# Patient Record
Sex: Female | Born: 1990 | Race: Black or African American | Hispanic: No | Marital: Single | Smoking: Former smoker
Health system: Southern US, Community
[De-identification: ages and names within clinical notes are randomized; demographics above are authoritative.]

## PROBLEM LIST (undated history)

## (undated) DIAGNOSIS — L732 Hidradenitis suppurativa: Secondary | ICD-10-CM

## (undated) DIAGNOSIS — L02411 Cutaneous abscess of right axilla: Secondary | ICD-10-CM

## (undated) DIAGNOSIS — E538 Deficiency of other specified B group vitamins: Secondary | ICD-10-CM

## (undated) DIAGNOSIS — R0602 Shortness of breath: Secondary | ICD-10-CM

## (undated) DIAGNOSIS — N390 Urinary tract infection, site not specified: Secondary | ICD-10-CM

## (undated) DIAGNOSIS — E559 Vitamin D deficiency, unspecified: Secondary | ICD-10-CM

## (undated) DIAGNOSIS — Z91018 Allergy to other foods: Secondary | ICD-10-CM

## (undated) HISTORY — DX: Shortness of breath: R06.02

## (undated) HISTORY — DX: Vitamin D deficiency, unspecified: E55.9

## (undated) HISTORY — DX: Hidradenitis suppurativa: L73.2

## (undated) HISTORY — DX: Deficiency of other specified B group vitamins: E53.8

## (undated) HISTORY — DX: Allergy to other foods: Z91.018

---

## 2011-04-22 ENCOUNTER — Inpatient Hospital Stay (INDEPENDENT_AMBULATORY_CARE_PROVIDER_SITE_OTHER)
Admission: RE | Admit: 2011-04-22 | Discharge: 2011-04-22 | Disposition: A | Discharge status: Home / Self Care | Payer: 59 | Source: Ambulatory Visit | Attending: Family Medicine | Admitting: Family Medicine

## 2011-04-22 DIAGNOSIS — L02419 Cutaneous abscess of limb, unspecified: Secondary | ICD-10-CM

## 2011-04-25 ENCOUNTER — Inpatient Hospital Stay (HOSPITAL_COMMUNITY)
Admission: RE | Admit: 2011-04-25 | Discharge: 2011-04-25 | Disposition: A | Discharge status: Home / Self Care | Payer: 59 | Source: Ambulatory Visit | Attending: Emergency Medicine | Admitting: Emergency Medicine

## 2011-10-17 ENCOUNTER — Emergency Department (INDEPENDENT_AMBULATORY_CARE_PROVIDER_SITE_OTHER)
Admission: EM | Admit: 2011-10-17 | Discharge: 2011-10-17 | Disposition: A | Discharge status: Home / Self Care | Payer: 59 | Source: Home / Self Care | Attending: Family Medicine | Admitting: Family Medicine

## 2011-10-17 ENCOUNTER — Encounter: Payer: Self-pay | Admitting: *Deleted

## 2011-10-17 DIAGNOSIS — L0390 Cellulitis, unspecified: Secondary | ICD-10-CM

## 2011-10-17 DIAGNOSIS — L0291 Cutaneous abscess, unspecified: Secondary | ICD-10-CM

## 2011-10-17 MED ORDER — DOXYCYCLINE HYCLATE 100 MG PO CAPS: 100.0000 mg | ORAL_CAPSULE | Freq: Two times a day (BID) | ORAL | Status: AC

## 2011-10-17 NOTE — ED Provider Notes (Signed)
History     CSN: [id number] Arrival date & time: 10/17/2011  2:08 PM   First MD Initiated Contact with Patient 10/17/11 1144      Chief Complaint  Patient presents with  . Recurrent Skin Infections    pt with ? abcess right groin onset x few days worse last night - painful     (Consider location/radiation/quality/duration/timing/severity/associated sxs/prior treatment) Patient is a 20 y.o. female presenting with abscess. The history is provided by the patient.  Abscess  This is a new problem. The current episode started less than one week ago. The problem occurs occasionally. The problem has been gradually worsening. The abscess is present on the right upper leg. The problem is mild. The abscess is characterized by redness and painfulness. Past medical history comments: hx of mrsa.    History reviewed. No pertinent past medical history.  History reviewed. No pertinent past surgical history.  History reviewed. No pertinent family history.  History  Substance Use Topics  . Smoking status: Current Everyday Smoker  . Smokeless tobacco: Not on file  . Alcohol Use: No    OB History    Grav Para Term Preterm Abortions TAB SAB Ect Mult Living                  Review of Systems  Constitutional: Negative.   HENT: Negative.   Respiratory: Negative.   Cardiovascular: Negative.   Gastrointestinal: Negative.   Genitourinary: Negative.     Allergies  Fish allergy  Home Medications   Current Outpatient Rx  Name Route Sig Dispense Refill  . DOXYCYCLINE HYCLATE 100 MG PO CAPS Oral Take 1 capsule (100 mg total) by mouth 2 (two) times daily. 20 capsule 0    BP 127/78  Pulse 80  Temp(Src) 98.5 F (36.9 C) (Oral)  Resp 17  SpO2 100%  Physical Exam  Nursing note and vitals reviewed. Constitutional: She appears well-developed and well-nourished. No distress.  Cardiovascular: Normal rate.   Pulmonary/Chest: Effort normal and breath sounds normal.  Skin:       flocculent  abscess right groin. Painful     ED Course  INCISION AND DRAINAGE Date/Time: 10/17/2011 2:31 PM Performed by: Randa Spike Authorized by: Randa Spike Consent: Verbal consent obtained. Risks and benefits: risks, benefits and alternatives were discussed Consent given by: patient Type: abscess Body area: lower extremity (right groin) Anesthesia: local infiltration Local anesthetic: lidocaine 2% with epinephrine Anesthetic total: 2 ml Patient sedated: no Scalpel size: 11 Incision type: cruze. Drainage: purulent (foul smelling) Drainage amount: moderate Patient tolerance: Patient tolerated the procedure well with no immediate complications (gauze dressing applied. good hemostasis).   (including critical care time)  Labs Reviewed - No data to display No results found.   1. Abscess       MDM          Randa Spike, MD 10/17/11 [phone]

## 2012-02-13 ENCOUNTER — Emergency Department (HOSPITAL_COMMUNITY)
Admission: EM | Admit: 2012-02-13 | Discharge: 2012-02-14 | Disposition: A | Discharge status: Home / Self Care | Payer: 59 | Attending: Emergency Medicine | Admitting: Emergency Medicine

## 2012-02-13 ENCOUNTER — Emergency Department (HOSPITAL_COMMUNITY): Payer: 59

## 2012-02-13 ENCOUNTER — Encounter (HOSPITAL_COMMUNITY): Payer: Self-pay | Admitting: Emergency Medicine

## 2012-02-13 DIAGNOSIS — N898 Other specified noninflammatory disorders of vagina: Secondary | ICD-10-CM | POA: Insufficient documentation

## 2012-02-13 DIAGNOSIS — Z3201 Encounter for pregnancy test, result positive: Secondary | ICD-10-CM | POA: Insufficient documentation

## 2012-02-13 DIAGNOSIS — N939 Abnormal uterine and vaginal bleeding, unspecified: Secondary | ICD-10-CM

## 2012-02-13 DIAGNOSIS — R109 Unspecified abdominal pain: Secondary | ICD-10-CM | POA: Insufficient documentation

## 2012-02-13 LAB — URINALYSIS, ROUTINE W REFLEX MICROSCOPIC
Bilirubin Urine: NEGATIVE
Glucose, UA: NEGATIVE mg/dL
Ketones, ur: NEGATIVE mg/dL
Leukocytes, UA: NEGATIVE
Protein, ur: NEGATIVE mg/dL
pH: 8 (ref 5.0–8.0)

## 2012-02-13 LAB — URINE MICROSCOPIC-ADD ON

## 2012-02-13 LAB — ABO/RH: ABO/RH(D): O POS

## 2012-02-13 LAB — HCG, QUANTITATIVE, PREGNANCY: hCG, Beta Chain, Quant, S: 726 m[IU]/mL — ABNORMAL HIGH (ref <5)

## 2012-02-13 NOTE — Discharge Instructions (Signed)
If you develop abdominal pain, lightheadedness, fever or increased bleeding return to the emergency department immediately

## 2012-02-13 NOTE — ED Notes (Signed)
Pt is currently pregnant about 8 weeks, reports went to bathroom about 20-30 mins ago and noticed bright red blood, pt also reports cramping.

## 2012-02-13 NOTE — ED Notes (Signed)
Called Ultrasound to get a timeframe on ultrasound, and they stated she is next. Notified family. Will continue to monitor.

## 2012-02-14 NOTE — ED Provider Notes (Signed)
  I performed a history and physical examination of Leslie Adams and discussed her management with Dr. Criss Alvine.  I agree with the history, physical, assessment, and plan of care, with the following exceptions: None  This young female, presents with vaginal bleeding, closed os, hCG less than 1000.  Patient discharged with explicit instructions to return in 2 days for repeat lab draw.  Leslie Jarvis, MD 02/14/12 [phone]

## 2012-02-14 NOTE — ED Provider Notes (Signed)
History     CSN: [id number]  Arrival date & time 02/13/12  1736   First MD Initiated Contact with Patient 02/13/12 1840      Chief Complaint  Patient presents with  . Vaginal Bleeding  . Abdominal Cramping    (Consider location/radiation/quality/duration/timing/severity/associated sxs/prior treatment) Patient is a 21 y.o. female presenting with vaginal bleeding. The history is provided by the patient.  Vaginal Bleeding This is a new problem. The current episode started today. The problem occurs 2 to 4 times per day. The problem has been unchanged. Associated symptoms include abdominal pain. Pertinent negatives include no chest pain, chills, congestion, fever, headaches, nausea, numbness, rash, vomiting or weakness. The symptoms are aggravated by nothing. She has tried nothing for the symptoms.    History reviewed. No pertinent past medical history.  History reviewed. No pertinent past surgical history.  No family history on file.  History  Substance Use Topics  . Smoking status: Former Smoker    Quit date: 02/06/2012  . Smokeless tobacco: Not on file  . Alcohol Use: No    OB History    Grav Para Term Preterm Abortions TAB SAB Ect Mult Living                  Review of Systems  Constitutional: Negative for fever and chills.  HENT: Negative for congestion and rhinorrhea.   Respiratory: Negative for shortness of breath.   Cardiovascular: Negative for chest pain and leg swelling.  Gastrointestinal: Positive for abdominal pain. Negative for nausea, vomiting and constipation.  Genitourinary: Positive for vaginal bleeding, menstrual problem (LMP 1/18) and pelvic pain. Negative for urgency, decreased urine volume, vaginal discharge and difficulty urinating.  Skin: Negative for rash and wound.  Neurological: Negative for syncope, weakness, light-headedness, numbness and headaches.  Psychiatric/Behavioral: Negative for confusion.  All other systems reviewed and are  negative.    Allergies  Fish allergy  Home Medications  No current outpatient prescriptions on file.  BP 118/72  Pulse 72  Temp(Src) 98.4 F (36.9 C) (Oral)  Resp 18  SpO2 98%  LMP 12/17/2011  Physical Exam  Nursing note and vitals reviewed. Constitutional: She is oriented to person, place, and time. She appears well-developed and well-nourished. No distress.  HENT:  Head: Normocephalic and atraumatic.  Right Ear: External ear normal.  Left Ear: External ear normal.  Nose: Nose normal.  Mouth/Throat: Oropharynx is clear and moist.  Neck: Neck supple.  Cardiovascular: Normal rate, regular rhythm, normal heart sounds and intact distal pulses.   Pulmonary/Chest: Effort normal and breath sounds normal. No respiratory distress. She has no wheezes. She has no rales.  Abdominal: Soft. She exhibits no distension. There is no tenderness. There is no CVA tenderness.  Genitourinary: Vagina normal. Uterus is not tender. Cervix exhibits no motion tenderness, no discharge and no friability.       Cervical Os closed. Dark blood present in vault  Musculoskeletal: She exhibits no edema.  Lymphadenopathy:    She has no cervical adenopathy.  Neurological: She is alert and oriented to person, place, and time.  Skin: Skin is warm and dry. She is not diaphoretic. No pallor.    ED Course  Procedures (including critical care time)  Labs Reviewed  URINALYSIS, ROUTINE W REFLEX MICROSCOPIC - Abnormal; Notable for the following:    Hgb urine dipstick LARGE (*)    Urobilinogen, UA 2.0 (*)    All other components within normal limits  POCT PREGNANCY, URINE - Abnormal; Notable for the following:  Preg Test, Ur POSITIVE (*)    All other components within normal limits  URINE MICROSCOPIC-ADD ON - Abnormal; Notable for the following:    Squamous Epithelial / LPF FEW (*)    All other components within normal limits  HCG, QUANTITATIVE, PREGNANCY - Abnormal; Notable for the following:    hCG,  Beta Chain, Quant, S 726 (*)    All other components within normal limits  ABO/RH   US Ob Comp Less 14 Wks  02/13/2012  *RADIOLOGY REPORT*  Clinical Data: Bleeding.  Abdominal pain.  OBSTETRIC <14 WK ULTRASOUND, TRANSVAGINAL OB US  Technique:  Transabdominal and transvaginal ultrasound was performed for evaluation of the gestation as well as the maternal uterus and adnexal regions.  Findings:  No intrauterine gestational sac, yolk sac, embryo or fetal cardiac activity identified.  Maternal uterus/adnexae: The right ovary appears normal.  The left ovary is also normal.  The endometrium measures 11.3 mm in thickness containing complex debris and fluid.  Fluid is also identified within the cervix.  No free fluid within the pelvis.  IMPRESSION:  1.  No evidence for living intrauterine pregnancy. 2.  The endometrial cavity appears filled with complex debris and there is fluid within the cervical canal.  Findings may represent abortion in progress. Advise follow-up imaging as and 7-10 days along with serial quantitative Beta HCG.  Original Report Authenticated By: Rosealee Albee, M.D.   US Ob Transvaginal  02/13/2012  *RADIOLOGY REPORT*  Clinical Data: Bleeding.  Abdominal pain.  OBSTETRIC <14 WK ULTRASOUND, TRANSVAGINAL OB US  Technique:  Transabdominal and transvaginal ultrasound was performed for evaluation of the gestation as well as the maternal uterus and adnexal regions.  Findings:  No intrauterine gestational sac, yolk sac, embryo or fetal cardiac activity identified.  Maternal uterus/adnexae: The right ovary appears normal.  The left ovary is also normal.  The endometrium measures 11.3 mm in thickness containing complex debris and fluid.  Fluid is also identified within the cervix.  No free fluid within the pelvis.  IMPRESSION:  1.  No evidence for living intrauterine pregnancy. 2.  The endometrial cavity appears filled with complex debris and there is fluid within the cervical canal.  Findings may  represent abortion in progress. Advise follow-up imaging as and 7-10 days along with serial quantitative Beta HCG.  Original Report Authenticated By: Rosealee Albee, M.D.     1. Pregnancy test positive   2. Vaginal bleeding       MDM  21 yo female who believes she's [redacted] weeks pregnant (LMP 1/18) presents with 1 day of vaginal bleeding and lower abd cramping. No abd tenderness elicited, no VS instability. No UTI. Cervical os closed, no ovarian fullness, tenderness. TV u/s does not see ectopic or IUP. Beta HCG 726, below threshold to see IUP or ectopic. [facility] as well as repeat U/S. Discussed strict return precautions for possible ectopic.         Pricilla Loveless, MD 02/14/12 [phone]

## 2012-08-04 ENCOUNTER — Encounter (HOSPITAL_COMMUNITY): Payer: Self-pay | Admitting: *Deleted

## 2012-08-04 ENCOUNTER — Emergency Department (INDEPENDENT_AMBULATORY_CARE_PROVIDER_SITE_OTHER)
Admission: EM | Admit: 2012-08-04 | Discharge: 2012-08-04 | Disposition: A | Discharge status: Home / Self Care | Payer: 59 | Source: Home / Self Care | Attending: Emergency Medicine | Admitting: Emergency Medicine

## 2012-08-04 DIAGNOSIS — IMO0002 Reserved for concepts with insufficient information to code with codable children: Secondary | ICD-10-CM

## 2012-08-04 DIAGNOSIS — L02411 Cutaneous abscess of right axilla: Secondary | ICD-10-CM

## 2012-08-04 HISTORY — DX: Cutaneous abscess of right axilla: L02.411

## 2012-08-04 MED ORDER — LIDOCAINE-EPINEPHRINE 2 %-1:100000 IJ SOLN
5.0000 mL | Freq: Once | INTRAMUSCULAR | Status: AC
  Administered 2012-08-04: 5 mL

## 2012-08-04 MED ORDER — MUPIROCIN 2 % EX OINT: TOPICAL_OINTMENT | Freq: Three times a day (TID) | CUTANEOUS | Status: AC

## 2012-08-04 MED ORDER — CHLORHEXIDINE GLUCONATE 4 % EX LIQD: 60.0000 mL | Freq: Every day | CUTANEOUS | Status: AC | PRN

## 2012-08-04 MED ORDER — BACITRACIN 500 UNIT/GM EX OINT
1.0000 "application " | TOPICAL_OINTMENT | Freq: Once | CUTANEOUS | Status: AC
  Administered 2012-08-04: 1 via TOPICAL

## 2012-08-04 MED ORDER — IBUPROFEN 600 MG PO TABS: 600.0000 mg | ORAL_TABLET | Freq: Four times a day (QID) | ORAL | Status: AC | PRN

## 2012-08-04 MED ORDER — HYDROCODONE-ACETAMINOPHEN 5-325 MG PO TABS: 2.0000 | ORAL_TABLET | ORAL | Status: AC | PRN

## 2012-08-04 MED ORDER — DOXYCYCLINE HYCLATE 100 MG PO CAPS: 100.0000 mg | ORAL_CAPSULE | Freq: Two times a day (BID) | ORAL | Status: AC

## 2012-08-04 NOTE — ED Provider Notes (Signed)
History     CSN: [id number]  Arrival date & time 08/04/12  0845   First MD Initiated Contact with Patient 08/04/12 0920      Chief Complaint  Patient presents with  . Abscess    (Consider location/radiation/quality/duration/timing/severity/associated sxs/prior treatment) HPI Comments: Pt with painful erythematous mass of gradually increasing size in R axilla starting several days ago. No N/V, fevers, drainage. Has been applying warm compresses  w/o relief. Has not tried anything for pain. H/o recurrent skin infections in this area. Patient is not diabetic.   ROS as noted in HPI. All other ROS negative.   Patient is a 21 y.o. female presenting with abscess. The history is provided by the patient. No language interpreter was used.  Abscess  This is a recurrent problem. The current episode started less than one week ago. The onset was gradual. The problem has been gradually worsening. Affected Location: R axilla. The problem is moderate. The abscess is characterized by painfulness and redness. It is unknown what she was exposed to. The abscess first occurred at home. Pertinent negatives include no fever.    Past Medical History  Diagnosis Date  . Abscess of axilla, right     History reviewed. No pertinent past surgical history.  History reviewed. No pertinent family history.  History  Substance Use Topics  . Smoking status: Former Smoker    Quit date: 02/06/2012  . Smokeless tobacco: Not on file  . Alcohol Use: No    OB History    Grav Para Term Preterm Abortions TAB SAB Ect Mult Living                  Review of Systems  Constitutional: Negative for fever.    Allergies  Fish allergy  Home Medications   Current Outpatient Rx  Name Route Sig Dispense Refill  . ACETAMINOPHEN 500 MG PO TABS Oral Take 500 mg by mouth every 6 (six) hours as needed.    . CHLORHEXIDINE GLUCONATE 4 % EX LIQD Topical Apply 60 mLs (4 application total) topically daily as needed. Use  daily when bathing for 1-2 weeks 120 mL 0  . DOXYCYCLINE HYCLATE 100 MG PO CAPS Oral Take 1 capsule (100 mg total) by mouth 2 (two) times daily. 20 capsule 0  . HYDROCODONE-ACETAMINOPHEN 5-325 MG PO TABS Oral Take 2 tablets by mouth every 4 (four) hours as needed for pain. 20 tablet 0  . IBUPROFEN 600 MG PO TABS Oral Take 1 tablet (600 mg total) by mouth every 6 (six) hours as needed for pain. 30 tablet 0  . MUPIROCIN 2 % EX OINT Topical Apply topically 3 (three) times daily. Apply after warm soak for 10 minutes 22 g 0    BP 131/78  Pulse 80  Temp 98.6 F (37 C) (Oral)  Resp 18  SpO2 100%  LMP 07/21/2012  Physical Exam  Nursing note and vitals reviewed. Constitutional: She is oriented to person, place, and time. She appears well-developed and well-nourished. No distress.  HENT:  Head: Normocephalic and atraumatic.  Eyes: Conjunctivae and EOM are normal.  Neck: Normal range of motion.  Cardiovascular: Normal rate.   Pulmonary/Chest: Effort normal.         3x4 centimeter area of tender erythema central induration. R axilla, see drawing. No expressible purulent drainage.  Abdominal: She exhibits no distension.  Musculoskeletal: Normal range of motion.  Neurological: She is alert and oriented to person, place, and time. Coordination normal.  Skin: Skin is warm and  dry.  Psychiatric: She has a normal mood and affect. Her behavior is normal. Judgment and thought content normal.    ED Course  INCISION AND DRAINAGE Date/Time: 08/04/2012 10:02 AM Performed by: Luiz Blare Authorized by: Luiz Blare Consent: Verbal consent obtained. Risks and benefits: risks, benefits and alternatives were discussed Consent given by: patient Patient understanding: patient states understanding of the procedure being performed Patient consent: the patient's understanding of the procedure matches consent given Required items: required blood products, implants, devices, and special  equipment available Patient identity confirmed: verbally with patient Time out: Immediately prior to procedure a "time out" was called to verify the correct patient, procedure, equipment, support staff and site/side marked as required. Type: abscess Body area: upper extremity (R axilla) Anesthesia: local infiltration Local anesthetic: lidocaine 2% with epinephrine Anesthetic total: 4 ml Patient sedated: no Scalpel size: 11 Incision type: cruciate. Drainage: purulent Drainage amount: copious Wound treatment: wound left open Packing material: none Patient tolerance: Patient tolerated the procedure well with no immediate complications. Comments: Performed blunt dissection to break up loculations, irrigated with 6 mL lido with epi 2%. cx sent   (including critical care time)   Labs Reviewed  CULTURE, ROUTINE-ABSCESS   No results found.   1. Abscess of right axilla     MDM  Send purulent material for culture. Home On doxycycline, Norco, ibuprofen, warm compresses. Prescribing Bactroban, as this is a recurrent issue. Patient will followup here or with primary care physician of her choice in 2 days if no significant improvement. Discussed signs and symptoms that should prompt earlier return. Patient agrees with plan.  Luiz Blare, MD 08/05/12 [phone]

## 2012-08-04 NOTE — ED Notes (Signed)
Pt is here with complaints of right axilla abcess.

## 2012-08-07 LAB — CULTURE, ROUTINE-ABSCESS

## 2012-08-07 NOTE — ED Notes (Signed)
Abscess culture R axilla: Mod. Staph. Aureus.  Pt. adequately treated with Doxycycline.  Vassie Moselle 08/07/2012

## 2012-11-23 ENCOUNTER — Encounter (HOSPITAL_COMMUNITY): Payer: Self-pay | Admitting: *Deleted

## 2012-11-23 ENCOUNTER — Emergency Department (HOSPITAL_COMMUNITY)
Admission: EM | Admit: 2012-11-23 | Discharge: 2012-11-23 | Disposition: A | Discharge status: Home / Self Care | Payer: 59 | Attending: Emergency Medicine | Admitting: Emergency Medicine

## 2012-11-23 DIAGNOSIS — Z872 Personal history of diseases of the skin and subcutaneous tissue: Secondary | ICD-10-CM | POA: Insufficient documentation

## 2012-11-23 DIAGNOSIS — N390 Urinary tract infection, site not specified: Secondary | ICD-10-CM

## 2012-11-23 DIAGNOSIS — Z3202 Encounter for pregnancy test, result negative: Secondary | ICD-10-CM | POA: Insufficient documentation

## 2012-11-23 DIAGNOSIS — Z79899 Other long term (current) drug therapy: Secondary | ICD-10-CM | POA: Insufficient documentation

## 2012-11-23 DIAGNOSIS — F17200 Nicotine dependence, unspecified, uncomplicated: Secondary | ICD-10-CM | POA: Insufficient documentation

## 2012-11-23 DIAGNOSIS — R319 Hematuria, unspecified: Secondary | ICD-10-CM | POA: Insufficient documentation

## 2012-11-23 DIAGNOSIS — Z87440 Personal history of urinary (tract) infections: Secondary | ICD-10-CM | POA: Insufficient documentation

## 2012-11-23 HISTORY — DX: Urinary tract infection, site not specified: N39.0

## 2012-11-23 LAB — URINALYSIS, ROUTINE W REFLEX MICROSCOPIC
Glucose, UA: NEGATIVE mg/dL
Specific Gravity, Urine: 1.023 (ref 1.005–1.030)
pH: 6.5 (ref 5.0–8.0)

## 2012-11-23 LAB — URINE MICROSCOPIC-ADD ON

## 2012-11-23 LAB — POCT PREGNANCY, URINE: Preg Test, Ur: NEGATIVE

## 2012-11-23 MED ORDER — PHENAZOPYRIDINE HCL 200 MG PO TABS: 200.0000 mg | ORAL_TABLET | Freq: Three times a day (TID) | ORAL | Status: DC

## 2012-11-23 MED ORDER — ALBUTEROL SULFATE (5 MG/ML) 0.5% IN NEBU
INHALATION_SOLUTION | RESPIRATORY_TRACT | Status: AC
  Filled 2012-11-23: qty 2

## 2012-11-23 MED ORDER — SULFAMETHOXAZOLE-TRIMETHOPRIM 800-160 MG PO TABS: 1.0000 | ORAL_TABLET | Freq: Two times a day (BID) | ORAL | Status: DC

## 2012-11-23 NOTE — ED Provider Notes (Signed)
History     CSN: [id number]  Arrival date & time 11/23/12  1110   First MD Initiated Contact with Patient 11/23/12 1140      Chief Complaint  Patient presents with  . Urinary Tract Infection    (Consider location/radiation/quality/duration/timing/severity/associated sxs/prior treatment) HPI  Pt presents to the ED with complaints of dysuria and a "pulling sensation" at the end of urinating. She also endorses some pink tinged tissue after voiding. She denies any odor, vomiting, abd, back pain, fevers, chills or weakness. States this is her first UTI. No vaginal complaints. nad vss  Past Medical History  Diagnosis Date  . Abscess of axilla, right   . UTI (lower urinary tract infection)     History reviewed. No pertinent past surgical history.  No family history on file.  History  Substance Use Topics  . Smoking status: Current Every Day Smoker    Last Attempt to Quit: 02/06/2012  . Smokeless tobacco: Not on file  . Alcohol Use: No    OB History    Grav Para Term Preterm Abortions TAB SAB Ect Mult Living                  Review of Systems  Review of Systems  Gen: no weight loss, fevers, chills, night sweats  Eyes: no discharge or drainage, no occular pain or visual changes  Nose: no epistaxis or rhinorrhea  Mouth: no dental pain, no sore throat  Neck: no neck pain  Lungs:No wheezing, coughing or hemoptysis CV: no chest pain, palpitations, dependent edema or orthopnea  Abd: no abdominal pain, nausea, vomiting  GU: + dysuria no gross hematuria  MSK:  No abnormalities  Neuro: no headache, no focal neurologic deficits  Skin: no abnormalities Psyche: negative.    Allergies  Fish allergy  Home Medications   Current Outpatient Rx  Name  Route  Sig  Dispense  Refill  . ACETAMINOPHEN 500 MG PO TABS   Oral   Take 500 mg by mouth every 6 (six) hours as needed.         Marland Kitchen PHENAZOPYRIDINE HCL 200 MG PO TABS   Oral   Take 1 tablet (200 mg total) by mouth 3  (three) times daily.   6 tablet   0   . SULFAMETHOXAZOLE-TRIMETHOPRIM 800-160 MG PO TABS   Oral   Take 1 tablet by mouth every 12 (twelve) hours.   10 tablet   0     BP 123/81  Pulse 81  Temp 98 F (36.7 C)  Resp 12  SpO2 99%  Physical Exam  Nursing note and vitals reviewed. Constitutional: She appears well-developed and well-nourished. No distress.  HENT:  Head: Normocephalic and atraumatic.  Eyes: Pupils are equal, round, and reactive to light.  Neck: Normal range of motion. Neck supple.  Cardiovascular: Normal rate and regular rhythm.   Pulmonary/Chest: Effort normal.  Abdominal: Soft.  Neurological: She is alert.  Skin: Skin is warm and dry.    ED Course  Procedures (including critical care time)  Labs Reviewed  URINALYSIS, ROUTINE W REFLEX MICROSCOPIC - Abnormal; Notable for the following:    APPearance CLOUDY (*)     Hgb urine dipstick LARGE (*)     Protein, ur 100 (*)     Leukocytes, UA MODERATE (*)     All other components within normal limits  URINE MICROSCOPIC-ADD ON - Abnormal; Notable for the following:    Bacteria, UA FEW (*)     All other components  within normal limits  POCT PREGNANCY, URINE  URINE CULTURE   No results found.   1. UTI (lower urinary tract infection)       MDM  Rx. Bactrim and pyridium. Urine culture sent out   Pt has been advised of the symptoms that warrant their return to the ED. Patient has voiced understanding and has agreed to follow-up with the PCP or specialist.        Dorthula Matas, PA 11/23/12 1228

## 2012-11-23 NOTE — ED Notes (Signed)
uti symptoms: at end of urination "feels like pulling." bleeding after urination not other times. No foul odor. Urine is cloudy. No vaginal d/c.

## 2012-11-24 LAB — URINE CULTURE: Culture: NO GROWTH

## 2012-11-26 NOTE — ED Provider Notes (Signed)
Medical screening examination/treatment/procedure(s) were performed by non-physician practitioner and as supervising physician I was immediately available for consultation/collaboration.  Jay Haskew R. Zarif Rathje, MD 11/26/12 0855 

## 2013-05-28 IMAGING — US US OB COMP LESS 14 WK
1 series · 14 of 28 positions shown · non-contrast
Comparison: none

CLINICAL DATA: Bleeding.  Abdominal pain.

OBSTETRIC <14 WK ULTRASOUND, TRANSVAGINAL OB US
TECHNIQUE: Transabdominal and transvaginal ultrasound was
performed for evaluation of the gestation as well as the maternal
uterus and adnexal regions.

[Series 1: us ob comp less 14 wk · 0.20mm/px · 14 of 51 slices shown]
[im 2/51]
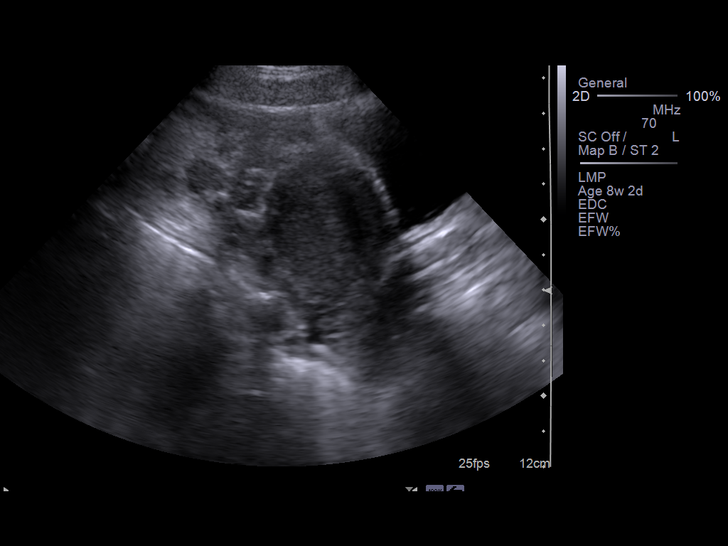
[im 6/51]
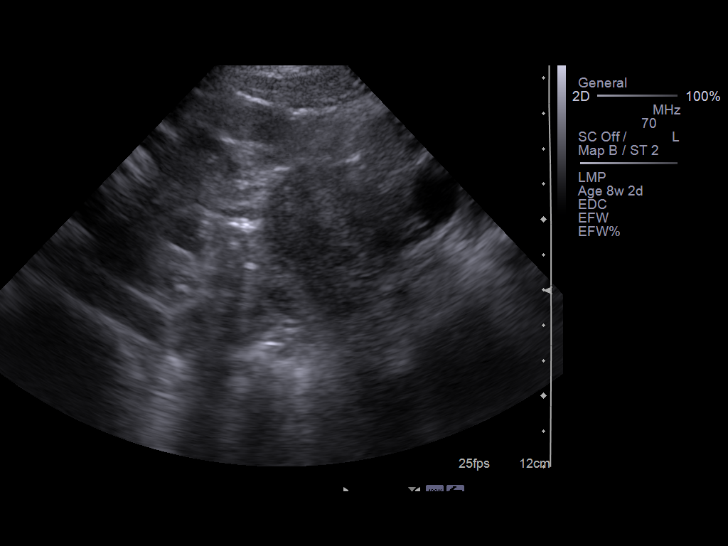
[im 10/51]
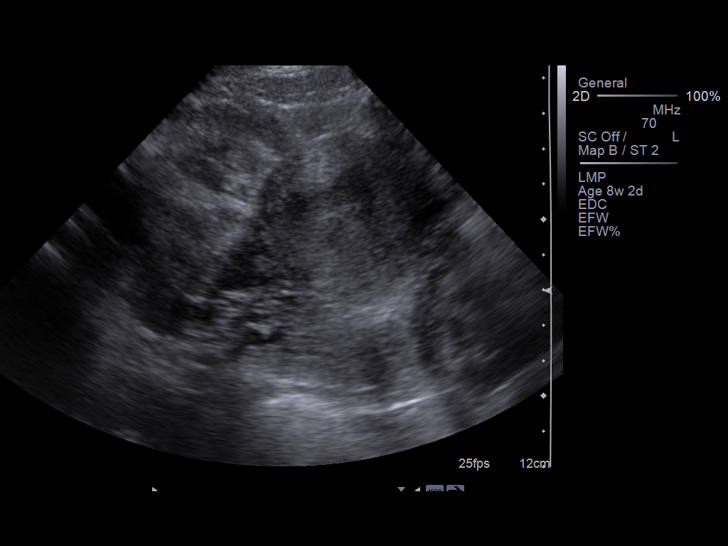
[im 13/51]
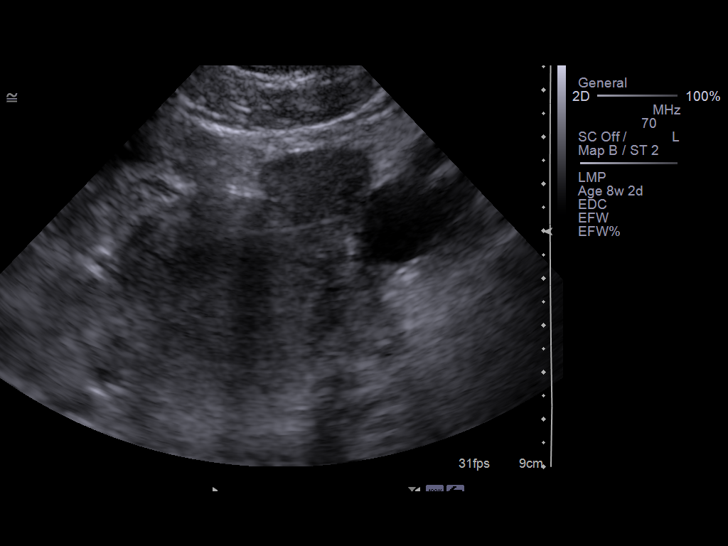
[im 17/51]
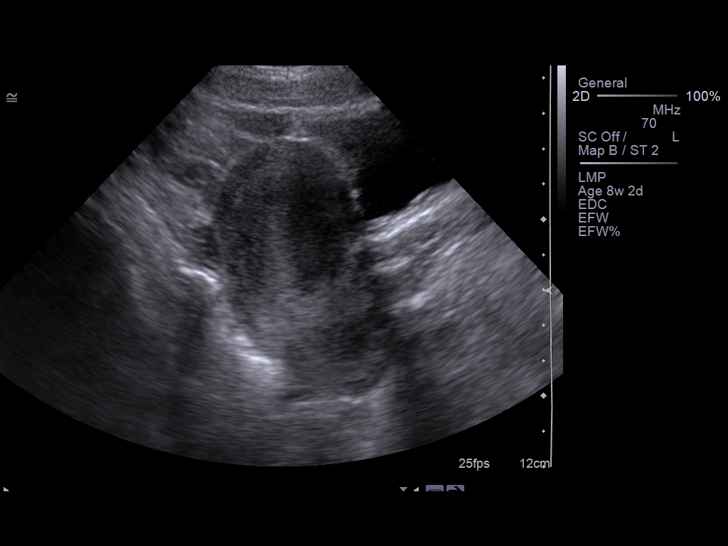
[im 21/51]
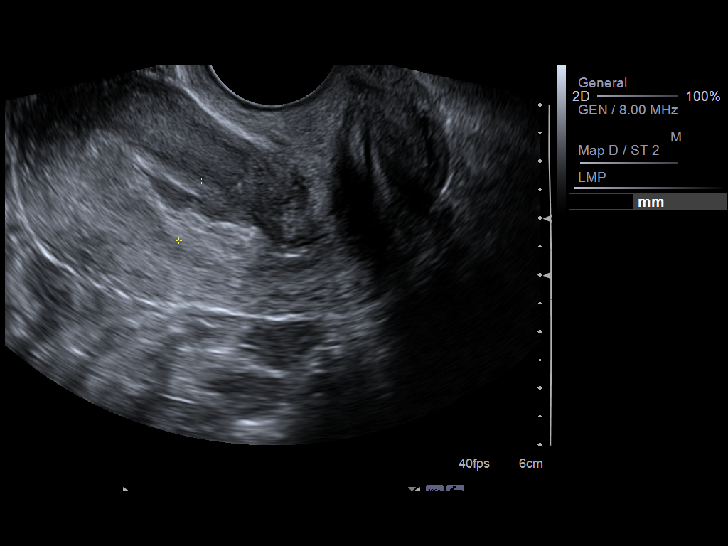
[im 25/51]
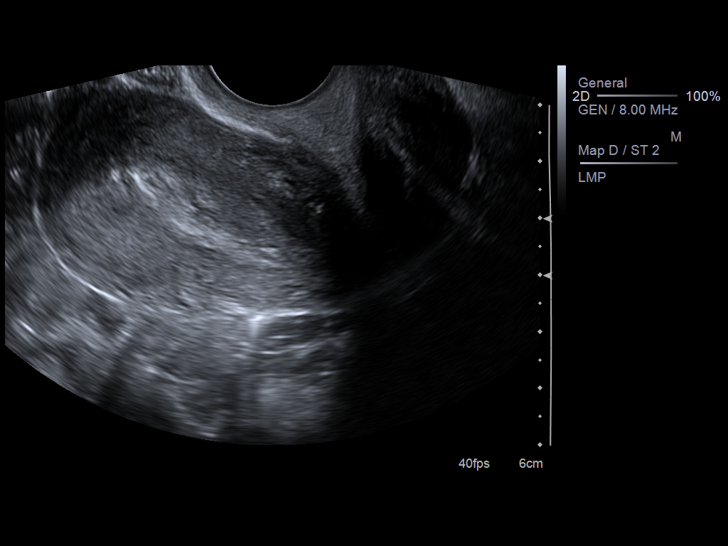
[im 28/51]
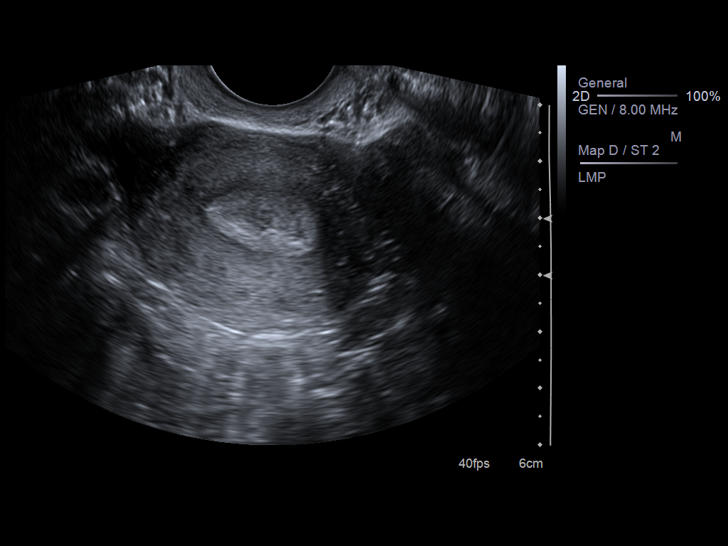
[im 32/51]
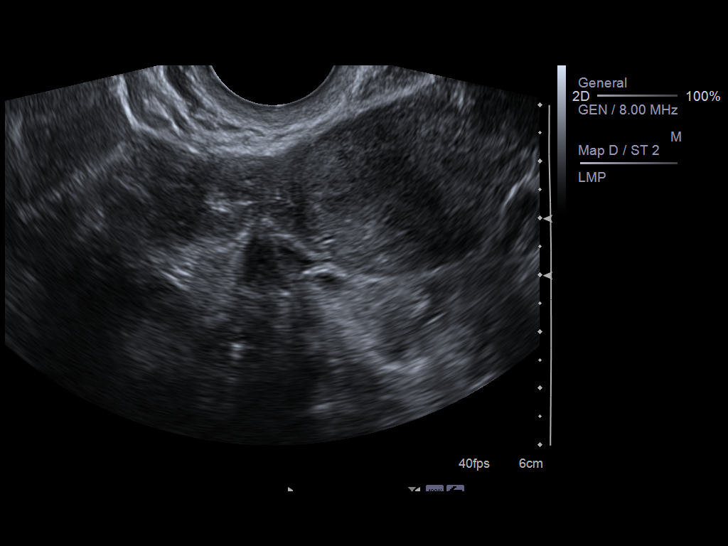
[im 36/51]
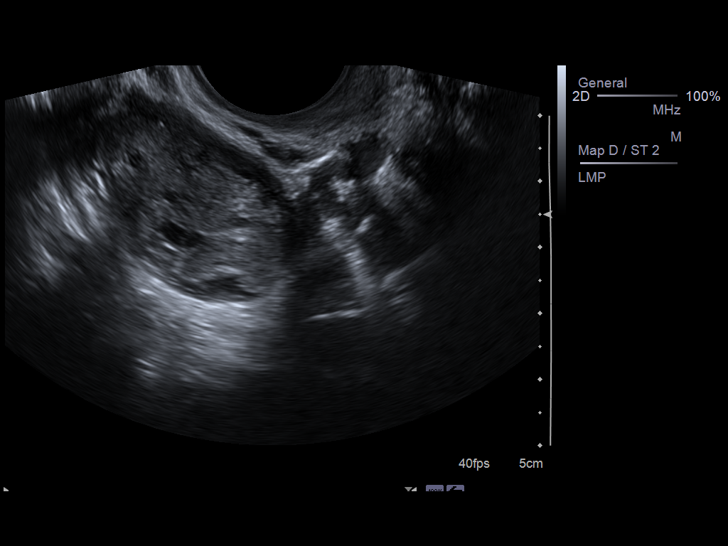
[im 39/51]
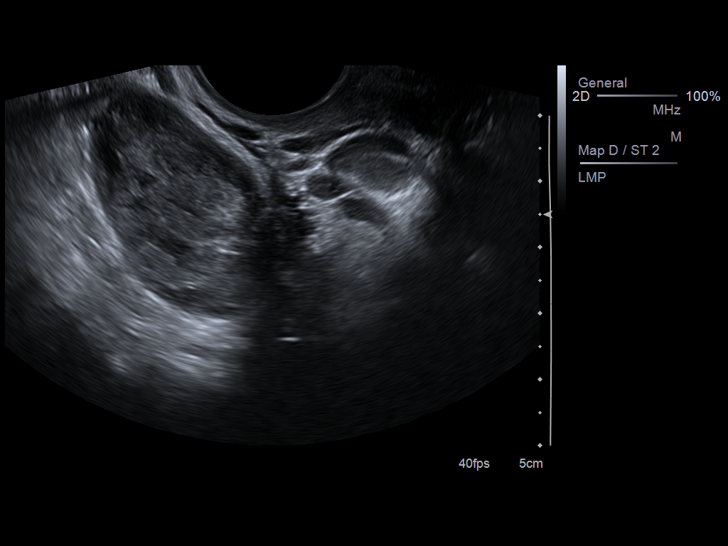
[im 43/51]
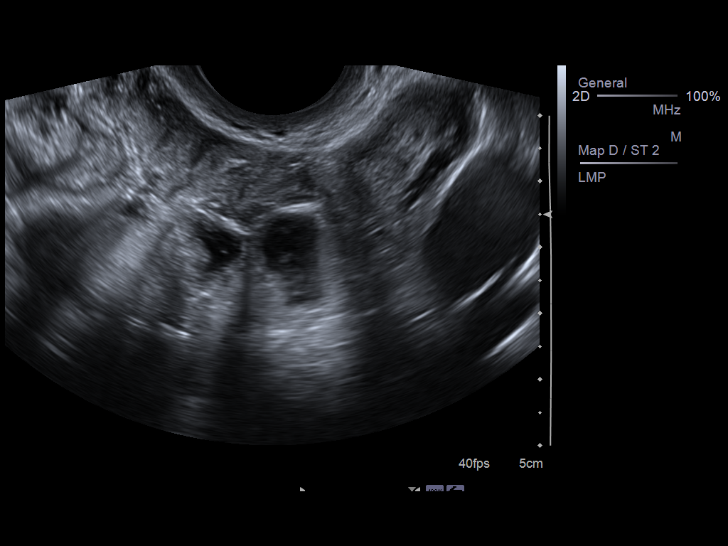
[im 47/51]
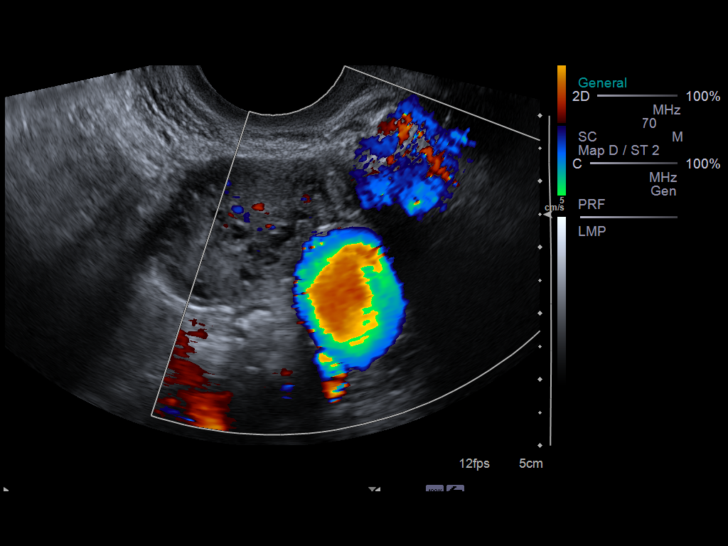
[im 51/51]
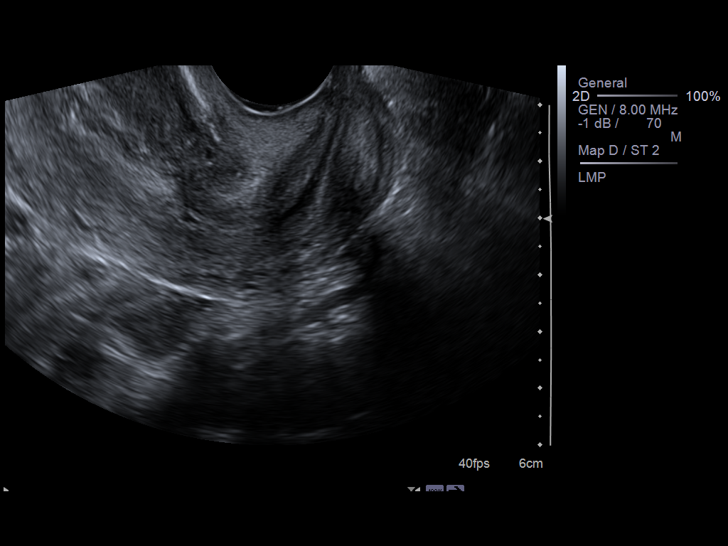

[14 of 28 positions shown; findings below may reference images not displayed]

FINDINGS: No intrauterine gestational sac, yolk sac, embryo or fetal cardiac
activity identified.

Maternal uterus/adnexae:
The right ovary appears normal.

The left ovary is also normal.

The endometrium measures 11.3 mm in thickness containing complex
debris and fluid.  Fluid is also identified within the cervix.

No free fluid within the pelvis.
IMPRESSION: 1.  No evidence for living intrauterine pregnancy.
2.  The endometrial cavity appears filled with complex debris and
there is fluid within the cervical canal.  Findings may represent
abortion in progress. Advise follow-up imaging as and 7-10 days
along with serial quantitative Beta HCG.

## 2013-12-06 ENCOUNTER — Emergency Department (HOSPITAL_COMMUNITY)
Admission: EM | Admit: 2013-12-06 | Discharge: 2013-12-06 | Disposition: A | Discharge status: Home / Self Care | Payer: 59 | Attending: Emergency Medicine | Admitting: Emergency Medicine

## 2013-12-06 ENCOUNTER — Encounter (HOSPITAL_COMMUNITY): Payer: Self-pay | Admitting: Emergency Medicine

## 2013-12-06 DIAGNOSIS — Z79899 Other long term (current) drug therapy: Secondary | ICD-10-CM | POA: Insufficient documentation

## 2013-12-06 DIAGNOSIS — Z87440 Personal history of urinary (tract) infections: Secondary | ICD-10-CM | POA: Insufficient documentation

## 2013-12-06 DIAGNOSIS — F17200 Nicotine dependence, unspecified, uncomplicated: Secondary | ICD-10-CM | POA: Insufficient documentation

## 2013-12-06 DIAGNOSIS — L0291 Cutaneous abscess, unspecified: Secondary | ICD-10-CM

## 2013-12-06 DIAGNOSIS — IMO0002 Reserved for concepts with insufficient information to code with codable children: Secondary | ICD-10-CM | POA: Insufficient documentation

## 2013-12-06 MED ORDER — SULFAMETHOXAZOLE-TRIMETHOPRIM 800-160 MG PO TABS: 2.0000 | ORAL_TABLET | Freq: Two times a day (BID) | ORAL | Status: DC

## 2013-12-06 NOTE — ED Notes (Signed)
Pt with abscess to R upper arm x 1.5 weeks. Pt states it is draining, but it is a "big hole".

## 2013-12-06 NOTE — ED Provider Notes (Signed)
CSN: [id number]     Arrival date & time 12/06/13  1212 History  This chart was scribed for non-physician practitioner working with Gilda Creasehristopher J. Pollina, MD by Ashley JacobsBrittany Andrews, ED scribe. This patient was seen in room TR07C/TR07C and the patient's care was started at 1:30 PM.   First MD Initiated Contact with Patient 12/06/13 1327     Chief Complaint  Patient presents with  . Abscess   (Consider location/radiation/quality/duration/timing/severity/associated sxs/prior Treatment) Patient is a 23 y.o. female presenting with abscess. The history is provided by the patient and medical records. No language interpreter was used.  Abscess Associated symptoms: no fever, no nausea and no vomiting    HPI Comments: Leslie Adams is a 23 y.o. female who presents to the Emergency Department complaining of right arm abscess with onset of 1.5 weeks PTA. The abscess has been draining purulent fluid and is tender.  She currently smokes cigarettes everyday. Pt has a past medical hx of abscess at her right axilla. Pt denies fever, chills, nausea, or vomiting.  She denies hx of HIV or DM.  Past Medical History  Diagnosis Date  . Abscess of axilla, right   . UTI (lower urinary tract infection)    History reviewed. No pertinent past surgical history. No family history on file. History  Substance Use Topics  . Smoking status: Current Every Day Smoker -- 0.25 packs/day    Types: Cigarettes    Last Attempt to Quit: 02/06/2012  . Smokeless tobacco: Not on file  . Alcohol Use: No   OB History   Grav Para Term Preterm Abortions TAB SAB Ect Mult Living                 Review of Systems  Constitutional: Negative for fever and chills.  Gastrointestinal: Negative for nausea and vomiting.  Skin: Positive for wound (right upper arm abscess).  All other systems reviewed and are negative.    Allergies  Fish allergy  Home Medications   Current Outpatient Rx  Name  Route  Sig  Dispense  Refill  .  acetaminophen (TYLENOL) 500 MG tablet   Oral   Take 500 mg by mouth every 6 (six) hours as needed.         . phenazopyridine (PYRIDIUM) 200 MG tablet   Oral   Take 1 tablet (200 mg total) by mouth 3 (three) times daily.   6 tablet   0   . sulfamethoxazole-trimethoprim (SEPTRA DS) 800-160 MG per tablet   Oral   Take 1 tablet by mouth every 12 (twelve) hours.   10 tablet   0    BP 130/75  Pulse 66  Temp(Src) 98.3 F (36.8 C) (Oral)  Resp 18  Ht 5' 5.5" (1.664 m)  Wt 188 lb (85.276 kg)  BMI 30.80 kg/m2  SpO2 100% Physical Exam  Nursing note and vitals reviewed. Constitutional: She is oriented to person, place, and time. She appears well-developed and well-nourished. No distress.  HENT:  Head: Normocephalic and atraumatic.  Eyes: Conjunctivae are normal. No scleral icterus.  Neck: Normal range of motion.  Cardiovascular: Normal rate, regular rhythm and intact distal pulses.   Pulmonary/Chest: Effort normal and breath sounds normal.  Abdominal: Soft. She exhibits no distension. There is no tenderness.  Lymphadenopathy:    She has no cervical adenopathy.  Neurological: She is alert and oriented to person, place, and time.  Skin: Skin is warm and dry. She is not diaphoretic. There is erythema.  1 cm area of  right upper arm that is open.  No active drainage.  Mild surrounding erythema and edema No fluctuance  Psychiatric: She has a normal mood and affect.    ED Course  Procedures (including critical care time) DIAGNOSTIC STUDIES: Oxygen Saturation is 100% on room air, normal by my interpretation.    COORDINATION OF CARE:  1:31 PM Discussed course of care with pt which includes incision and drainage  . Pt understands and agrees.  Labs Review Labs Reviewed - No data to display Imaging Review No results found.  EKG Interpretation   None      INCISION AND DRAINAGE Performed by: Santiago Glad Consent: Verbal consent obtained. Risks and benefits: risks,  benefits and alternatives were discussed Type: abscess  Body area: right upper arm  Anesthesia: local infiltration  Incision was made with a scalpel.  Local anesthetic: lidocaine 2% with epinephrine  Anesthetic total: 3 ml  Complexity: complex Blunt dissection to break up loculations  Drainage: none  Drainage amount: none  Patient tolerance: Patient tolerated the procedure well with no immediate complications.    MDM  No diagnosis found. Patient presenting with an abscess of her right upper arm.  Area has been actively draining and was open on exam.  Incision and drainage performed in the ED without additional drainage.  Patient stable for discharge.  Return precautions given.  I personally performed the services described in this documentation, which was scribed in my presence. The recorded information has been reviewed and is accurate.     Santiago Glad, PA-C 12/06/13 1542

## 2013-12-11 NOTE — ED Provider Notes (Signed)
Medical screening examination/treatment/procedure(s) were performed by non-physician practitioner and as supervising physician I was immediately available for consultation/collaboration.  Christopher J. Pollina, MD 12/11/13 1101 

## 2015-04-18 ENCOUNTER — Other Ambulatory Visit: Payer: Self-pay | Admitting: Obstetrics & Gynecology

## 2015-04-18 ENCOUNTER — Other Ambulatory Visit (HOSPITAL_COMMUNITY)
Admission: RE | Admit: 2015-04-18 | Discharge: 2015-04-18 | Disposition: A | Discharge status: Home / Self Care | Payer: 59 | Source: Ambulatory Visit | Attending: Obstetrics & Gynecology | Admitting: Obstetrics & Gynecology

## 2015-04-18 DIAGNOSIS — Z113 Encounter for screening for infections with a predominantly sexual mode of transmission: Secondary | ICD-10-CM | POA: Insufficient documentation

## 2015-04-18 DIAGNOSIS — Z01419 Encounter for gynecological examination (general) (routine) without abnormal findings: Secondary | ICD-10-CM | POA: Diagnosis present

## 2015-04-21 LAB — CYTOLOGY - PAP

## 2015-08-11 ENCOUNTER — Encounter (HOSPITAL_COMMUNITY): Payer: Self-pay | Admitting: *Deleted

## 2015-08-11 ENCOUNTER — Emergency Department (INDEPENDENT_AMBULATORY_CARE_PROVIDER_SITE_OTHER)
Admission: EM | Admit: 2015-08-11 | Discharge: 2015-08-11 | Disposition: A | Discharge status: Home / Self Care | Payer: 59 | Source: Home / Self Care | Attending: Family Medicine | Admitting: Family Medicine

## 2015-08-11 DIAGNOSIS — N390 Urinary tract infection, site not specified: Secondary | ICD-10-CM

## 2015-08-11 LAB — POCT URINALYSIS DIP (DEVICE)
Bilirubin Urine: NEGATIVE
GLUCOSE, UA: NEGATIVE mg/dL
Ketones, ur: NEGATIVE mg/dL
NITRITE: NEGATIVE
Protein, ur: NEGATIVE mg/dL
Specific Gravity, Urine: 1.02 (ref 1.005–1.030)
UROBILINOGEN UA: 1 mg/dL (ref 0.0–1.0)
pH: 7 (ref 5.0–8.0)

## 2015-08-11 LAB — POCT PREGNANCY, URINE: Preg Test, Ur: NEGATIVE

## 2015-08-11 MED ORDER — CEPHALEXIN 500 MG PO CAPS: 500.0000 mg | ORAL_CAPSULE | Freq: Four times a day (QID) | ORAL | Status: DC

## 2015-08-11 NOTE — Discharge Instructions (Signed)
Take all of medicine as directed, drink lots of fluids, see your doctor if further problems. °

## 2015-08-11 NOTE — ED Notes (Signed)
Pt  Reports    Symptoms  Of  Burning    And  Frequency  Of urination          For     About  1  Week  -  Pt    ambulated  To room   With a  Steady  Fluid  Gait      denys  Any  Vaginal bleeding  Or  Any  Discharge

## 2015-08-11 NOTE — ED Provider Notes (Signed)
CSN: [id number]     Arrival date & time 08/11/15  1746 History   First MD Initiated Contact with Patient 08/11/15 1918     Chief Complaint  Patient presents with  . Urinary Tract Infection   (Consider location/radiation/quality/duration/timing/severity/associated sxs/prior Treatment) Patient is a 24 y.o. female presenting with dysuria. The history is provided by the patient.  Dysuria Pain quality:  Burning Pain severity:  Mild Onset quality:  Gradual Duration:  1 week Progression:  Unchanged Chronicity:  New Recent urinary tract infections: no   Relieved by:  None tried Worsened by:  Nothing tried Ineffective treatments:  None tried Urinary symptoms: frequent urination   Associated symptoms: no fever, no flank pain, no nausea, no vaginal discharge and no vomiting     Past Medical History  Diagnosis Date  . Abscess of axilla, right   . UTI (lower urinary tract infection)    History reviewed. No pertinent past surgical history. History reviewed. No pertinent family history. Social History  Substance Use Topics  . Smoking status: Current Every Day Smoker -- 0.25 packs/day    Types: Cigarettes    Last Attempt to Quit: 02/06/2012  . Smokeless tobacco: None  . Alcohol Use: No   OB History    No data available     Review of Systems  Constitutional: Negative for fever.  Gastrointestinal: Negative for nausea and vomiting.  Genitourinary: Positive for dysuria, urgency and frequency. Negative for flank pain, vaginal bleeding and vaginal discharge.  All other systems reviewed and are negative.   Allergies  Fish allergy  Home Medications   Prior to Admission medications   Medication Sig Start Date End Date Taking? Authorizing Provider  acetaminophen (TYLENOL) 500 MG tablet Take 500 mg by mouth every 6 (six) hours as needed.    Historical Provider, MD  cephALEXin (KEFLEX) 500 MG capsule Take 1 capsule (500 mg total) by mouth 4 (four) times daily. Take all of medicine and  drink lots of fluids 08/11/15   Linna Hoff, MD  phenazopyridine (PYRIDIUM) 200 MG tablet Take 1 tablet (200 mg total) by mouth 3 (three) times daily. 11/23/12   Tiffany Neva Seat, PA-C  sulfamethoxazole-trimethoprim (SEPTRA DS) 800-160 MG per tablet Take 1 tablet by mouth every 12 (twelve) hours. 11/23/12   Tiffany Neva Seat, PA-C  sulfamethoxazole-trimethoprim (SEPTRA DS) 800-160 MG per tablet Take 2 tablets by mouth 2 (two) times daily. 12/06/13   Santiago Glad, PA-C   Meds Ordered and Administered this Visit  Medications - No data to display  BP 120/82 mmHg  Pulse 65  Temp(Src) 98.5 F (36.9 C) (Oral)  Resp 16  SpO2 100% No data found.   Physical Exam  Constitutional: She is oriented to person, place, and time. She appears well-developed and well-nourished.  Abdominal: Soft. Bowel sounds are normal. She exhibits no mass. There is no tenderness. There is no rebound and no guarding.  Neurological: She is alert and oriented to person, place, and time.  Skin: Skin is warm and dry.  Nursing note and vitals reviewed.   ED Course  Procedures (including critical care time)  Labs Review Labs Reviewed  POCT URINALYSIS DIP (DEVICE) - Abnormal; Notable for the following:    Hgb urine dipstick TRACE (*)    Leukocytes, UA SMALL (*)    All other components within normal limits   U/a pos Imaging Review No results found.   Visual Acuity Review  Right Eye Distance:   Left Eye Distance:   Bilateral Distance:  Right Eye Near:   Left Eye Near:    Bilateral Near:         MDM   1. UTI (lower urinary tract infection)        Linna Hoff, MD 08/11/15 1931

## 2016-04-27 ENCOUNTER — Ambulatory Visit (HOSPITAL_COMMUNITY)
Admission: EM | Admit: 2016-04-27 | Discharge: 2016-04-27 | Disposition: A | Discharge status: Home / Self Care | Payer: 59 | Attending: Family Medicine | Admitting: Family Medicine

## 2016-04-27 ENCOUNTER — Ambulatory Visit (INDEPENDENT_AMBULATORY_CARE_PROVIDER_SITE_OTHER): Payer: 59

## 2016-04-27 ENCOUNTER — Encounter (HOSPITAL_COMMUNITY): Payer: Self-pay | Admitting: Emergency Medicine

## 2016-04-27 DIAGNOSIS — S63619A Unspecified sprain of unspecified finger, initial encounter: Secondary | ICD-10-CM

## 2016-04-27 MED ORDER — TETANUS-DIPHTH-ACELL PERTUSSIS 5-2.5-18.5 LF-MCG/0.5 IM SUSP
INTRAMUSCULAR | Status: AC
  Filled 2016-04-27: qty 0.5

## 2016-04-27 MED ORDER — TETANUS-DIPHTH-ACELL PERTUSSIS 5-2.5-18.5 LF-MCG/0.5 IM SUSP
0.5000 mL | Freq: Once | INTRAMUSCULAR | Status: AC
  Administered 2016-04-27: 0.5 mL via INTRAMUSCULAR

## 2016-04-27 NOTE — Discharge Instructions (Signed)
Finger Sprain °A finger sprain happens when the bands of tissue that hold the finger bones together (ligaments) stretch too much and tear. °HOME CARE °· Keep your injured finger raised (elevated) when possible. °· Put ice on the injured area, twice a day, for 2 to 3 days. °¨ Put ice in a plastic bag. °¨ Place a towel between your skin and the bag. °¨ Leave the ice on for 15 minutes. °· Only take medicine as told by your doctor. °· Do not wear rings on the injured finger. °· Protect your finger until pain and stiffness go away (usually 3 to 4 weeks). °· Do not get your cast or splint to get wet. Cover your cast or splint with a plastic bag when you shower or bathe. Do not swim. °· Your doctor may suggest special exercises for you to do. These exercises will help keep or stop stiffness from happening. °GET HELP RIGHT AWAY IF: °· Your cast or splint gets damaged. °· Your pain gets worse, not better. °MAKE SURE YOU: °· Understand these instructions. °· Will watch your condition. °· Will get help right away if you are not doing well or get worse. °  °This information is not intended to replace advice given to you by your health care provider. Make sure you discuss any questions you have with your health care provider. °  °Document Released: 12/18/2010 Document Revised: 02/07/2012 Document Reviewed: 07/19/2011 °Elsevier Interactive Patient Education ©2016 [employer] ° °
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
## 2016-04-27 NOTE — ED Notes (Signed)
PT was discharged by Barbra SarksFrank Patrick, PA
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
## 2017-02-25 ENCOUNTER — Emergency Department (HOSPITAL_COMMUNITY)
Admission: EM | Admit: 2017-02-25 | Discharge: 2017-02-25 | Disposition: A | Discharge status: Home / Self Care | Payer: 59 | Attending: Emergency Medicine | Admitting: Emergency Medicine

## 2017-02-25 ENCOUNTER — Encounter (HOSPITAL_COMMUNITY): Payer: Self-pay | Admitting: *Deleted

## 2017-02-25 DIAGNOSIS — L02412 Cutaneous abscess of left axilla: Secondary | ICD-10-CM | POA: Diagnosis not present

## 2017-02-25 DIAGNOSIS — F17210 Nicotine dependence, cigarettes, uncomplicated: Secondary | ICD-10-CM | POA: Diagnosis not present

## 2017-02-25 LAB — POC URINE PREG, ED: PREG TEST UR: NEGATIVE

## 2017-02-25 MED ORDER — TRAMADOL HCL 50 MG PO TABS: 50.0000 mg | ORAL_TABLET | Freq: Four times a day (QID) | ORAL | 0 refills | Status: DC | PRN

## 2017-02-25 MED ORDER — SULFAMETHOXAZOLE-TRIMETHOPRIM 800-160 MG PO TABS: 1.0000 | ORAL_TABLET | Freq: Two times a day (BID) | ORAL | 0 refills | Status: DC

## 2017-02-25 MED ORDER — HYDROCODONE-ACETAMINOPHEN 5-325 MG PO TABS
1.0000 | ORAL_TABLET | Freq: Once | ORAL | Status: AC
Start: 2017-02-25 — End: 2017-02-25
  Administered 2017-02-25: 1 via ORAL
  Filled 2017-02-25: qty 1

## 2017-02-25 MED ORDER — CEPHALEXIN 500 MG PO CAPS: 1000.0000 mg | ORAL_CAPSULE | Freq: Two times a day (BID) | ORAL | 0 refills | Status: DC

## 2017-02-25 MED ORDER — LIDOCAINE-EPINEPHRINE (PF) 2 %-1:200000 IJ SOLN
10.0000 mL | Freq: Once | INTRAMUSCULAR | Status: AC
  Administered 2017-02-25: 10 mL via INTRADERMAL
  Filled 2017-02-25: qty 20

## 2017-02-25 NOTE — Discharge Planning (Signed)
Pt up for discharge. EDCM reviewed chart for possible CM needs.  No needs identified or communicated.  

## 2017-02-25 NOTE — ED Notes (Signed)
Pt out to desk upset about wait time. Has been 1 hour since PA signed up for pt and has not seen her yet. Apologized and updated on wait times.

## 2017-02-25 NOTE — Discharge Instructions (Signed)
Please follow up with your doctor in 2 days for wound check and packing removal.  Continue using warm compress, take antibiotics and pain medication as prescribed.  Return if you have any concerns.

## 2017-02-25 NOTE — ED Triage Notes (Signed)
Pt c/o two abscesses under L arm onset yesterday, denies drainage

## 2017-02-25 NOTE — ED Provider Notes (Signed)
[facility]-EMERGENCY DEPT Provider Note   CSN: [id number] Arrival date & time: 02/25/17  0503     History   Chief Complaint Chief Complaint  Patient presents with  . Abscess    HPI Leslie Adams is a 26 y.o. female.  HPI   26 year old female with history of recurrent abscess presenting complaining of abscess to her left armpit. She noticed increasing pain and swelling and drainage to her left armpit ongoing for nearly a week. Pain is described as a sharp throbbing sensation, worsening with palpation or with all movement, moderate in intensity and mildly improved with using warm compress. She denies any associated fever, chills, chest pain, focal numbness. She is up-to-date with tetanus. She denies any recent injury.  Past Medical History:  Diagnosis Date  . Abscess of axilla, right   . UTI (lower urinary tract infection)     There are no active problems to display for this patient.   History reviewed. No pertinent surgical history.  OB History    No data available       Home Medications    Prior to Admission medications   Medication Sig Start Date End Date Taking? Authorizing Provider  acetaminophen (TYLENOL) 500 MG tablet Take 500 mg by mouth every 6 (six) hours as needed.    Historical Provider, MD  cephALEXin (KEFLEX) 500 MG capsule Take 1 capsule (500 mg total) by mouth 4 (four) times daily. Take all of medicine and drink lots of fluids 08/11/15   Linna Hoff, MD  phenazopyridine (PYRIDIUM) 200 MG tablet Take 1 tablet (200 mg total) by mouth 3 (three) times daily. 11/23/12   Tiffany Neva Seat, PA-C  sulfamethoxazole-trimethoprim (SEPTRA DS) 800-160 MG per tablet Take 1 tablet by mouth every 12 (twelve) hours. 11/23/12   Tiffany Neva Seat, PA-C  sulfamethoxazole-trimethoprim (SEPTRA DS) 800-160 MG per tablet Take 2 tablets by mouth 2 (two) times daily. 12/06/13   Santiago Glad, PA-C    Family History No family history on file.  Social History Social History    Substance Use Topics  . Smoking status: Current Every Day Smoker    Packs/day: 0.25    Types: Cigarettes    Last attempt to quit: 02/06/2012  . Smokeless tobacco: Never Used  . Alcohol use Yes     Allergies   Fish allergy   Review of Systems Review of Systems  Constitutional: Negative for fever.  Respiratory: Negative for shortness of breath.   Cardiovascular: Negative for chest pain.     Physical Exam Updated Vital Signs BP 129/74   Pulse (!) 105   Temp 97.3 F (36.3 C)   Resp 18   LMP 02/21/2017   SpO2 99%   Physical Exam  Constitutional: She appears well-developed and well-nourished. No distress.  Obese female sitting in bed in no acute discomfort.  HENT:  Head: Atraumatic.  Eyes: Conjunctivae are normal.  Neck: Neck supple.  Neurological: She is alert.  Skin: No rash noted.  Left axillary fold: 2 separate area of induration with fluctuance with exquisite tenderness to palpation noted in the axillary fold with mild surrounding skin erythema.  Psychiatric: She has a normal mood and affect.  Nursing note and vitals reviewed.    ED Treatments / Results  Labs (all labs ordered are listed, but only abnormal results are displayed) Labs Reviewed  POC URINE PREG, ED    EKG  EKG Interpretation None       Radiology No results found.  Procedures Procedures (including critical care time)  INCISION AND DRAINAGE Performed by: Fayrene Helper Consent: Verbal consent obtained. Risks and benefits: risks, benefits and alternatives were discussed Type: abscess  Body area: L axillary fold, medial  Anesthesia: local infiltration  Incision was made with a scalpel.  Local anesthetic: lidocaine 2% w epinephrine  Anesthetic total: 3 ml  Complexity: complex Blunt dissection to break up loculations  Drainage: purulent  Drainage amount: moderate  Packing material: 1/4 in iodoform gauze  Patient tolerance: Patient tolerated the procedure well with no  immediate complications.  INCISION AND DRAINAGE Performed by: Fayrene Helper Consent: Verbal consent obtained. Risks and benefits: risks, benefits and alternatives were discussed Type: abscess  Body area: L axillary fold, lateral  Anesthesia: local infiltration  Incision was made with a scalpel.  Local anesthetic: lidocaine 2% w epinephrine  Anesthetic total: 4 ml  Complexity: complex Blunt dissection to break up loculations  Drainage: purulent  Drainage amount: moderate  Packing material: 1/4 in iodoform gauze  Patient tolerance: Patient tolerated the procedure well with no immediate complications.      Medications Ordered in ED Medications  HYDROcodone-acetaminophen (NORCO/VICODIN) 5-325 MG per tablet 1 tablet (1 tablet Oral Given 02/25/17 0727)  lidocaine-EPINEPHrine (XYLOCAINE W/EPI) 2 %-1:200000 (PF) injection 10 mL (10 mLs Intradermal Given 02/25/17 0750)     Initial Impression / Assessment and Plan / ED Course  I have reviewed the triage vital signs and the nursing notes.  Pertinent labs & imaging results that were available during my care of the patient were reviewed by me and considered in my medical decision making (see chart for details).     BP (!) 147/90 (BP Location: Right Arm)   Pulse 80   Temp 97.3 F (36.3 C)   Resp 18   LMP 02/21/2017   SpO2 100%    Final Clinical Impressions(s) / ED Diagnoses   Final diagnoses:  Abscess of left axilla    New Prescriptions New Prescriptions   TRAMADOL (ULTRAM) 50 MG TABLET    Take 1 tablet (50 mg total) by mouth every 6 (six) hours as needed.   7:44 AM Patient presents with recurrent abscesses in her left armpit. Abscesses are amenable for incision and drainage.  8:16 AM Successful I&D of 2 abscesses to L axillary fold with moderate amount of pustular discharge.  Pt d/c with abx, outpt f/u in 2 days for packing removal.  Pt currently not pregnant.    Fayrene Helper, PA-C 02/25/17 1606    Zadie Rhine, MD 02/27/17 Marlyne Beards

## 2017-02-25 NOTE — ED Notes (Signed)
Two incision sites dressed with non adherent dressing with Tegaderm.  Pt comfortable.  Pt asked to give urine sample.
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
## 2018-02-09 DIAGNOSIS — L732 Hidradenitis suppurativa: Secondary | ICD-10-CM | POA: Diagnosis not present

## 2018-02-09 DIAGNOSIS — L0291 Cutaneous abscess, unspecified: Secondary | ICD-10-CM | POA: Diagnosis not present

## 2018-08-01 DIAGNOSIS — L729 Follicular cyst of the skin and subcutaneous tissue, unspecified: Secondary | ICD-10-CM | POA: Diagnosis not present

## 2018-08-01 DIAGNOSIS — Z6841 Body Mass Index (BMI) 40.0 and over, adult: Secondary | ICD-10-CM | POA: Diagnosis not present

## 2018-08-01 DIAGNOSIS — Z23 Encounter for immunization: Secondary | ICD-10-CM | POA: Diagnosis not present

## 2018-11-29 DIAGNOSIS — H40033 Anatomical narrow angle, bilateral: Secondary | ICD-10-CM | POA: Diagnosis not present

## 2018-11-29 DIAGNOSIS — H04123 Dry eye syndrome of bilateral lacrimal glands: Secondary | ICD-10-CM | POA: Diagnosis not present

## 2019-01-04 DIAGNOSIS — L309 Dermatitis, unspecified: Secondary | ICD-10-CM | POA: Diagnosis not present

## 2019-03-13 DIAGNOSIS — Z0000 Encounter for general adult medical examination without abnormal findings: Secondary | ICD-10-CM | POA: Diagnosis not present

## 2019-03-13 DIAGNOSIS — Z719 Counseling, unspecified: Secondary | ICD-10-CM | POA: Diagnosis not present

## 2019-03-13 DIAGNOSIS — Z6841 Body Mass Index (BMI) 40.0 and over, adult: Secondary | ICD-10-CM | POA: Diagnosis not present

## 2019-03-13 DIAGNOSIS — R7309 Other abnormal glucose: Secondary | ICD-10-CM | POA: Diagnosis not present

## 2019-03-13 DIAGNOSIS — F17200 Nicotine dependence, unspecified, uncomplicated: Secondary | ICD-10-CM | POA: Diagnosis not present

## 2019-07-13 ENCOUNTER — Other Ambulatory Visit: Payer: 59 | Admitting: Adult Health

## 2019-12-12 ENCOUNTER — Ambulatory Visit: Payer: Self-pay | Attending: Internal Medicine

## 2020-02-14 ENCOUNTER — Ambulatory Visit: Payer: Self-pay | Attending: Internal Medicine

## 2020-02-14 DIAGNOSIS — Z23 Encounter for immunization: Secondary | ICD-10-CM

## 2020-02-14 NOTE — Progress Notes (Signed)
   Covid-19 Vaccination Clinic  Name:  Leslie Adams    MRN: [id number] DOB: [birth year]  02/14/2020  [name] was observed post Covid-19 immunization for 30 minutes based on pre-vaccination screening without incident. She was provided with Vaccine Information Sheet and instruction to access the V-Safe system.   [name] was instructed to call 911 with any severe reactions post vaccine: Marland Kitchen Difficulty breathing  . Swelling of face and throat  . A fast heartbeat  . A bad rash all over body  . Dizziness and weakness   Immunizations Administered    Name Date Dose VIS Date Route   Pfizer COVID-19 Vaccine 02/14/2020 12:43 PM 0.3 mL 11/09/2019 Intramuscular   Manufacturer: [employer], [employer]   Lot: [id number]   NDC: [address]-6

## 2020-03-10 ENCOUNTER — Ambulatory Visit: Payer: Self-pay

## 2021-10-01 ENCOUNTER — Telehealth: Payer: Self-pay | Admitting: Hematology and Oncology

## 2021-10-01 NOTE — Telephone Encounter (Signed)
Scheduled appt per 11/3 referral. Pt is aware of appt date and time.  

## 2021-10-02 ENCOUNTER — Telehealth: Payer: Self-pay | Admitting: Hematology and Oncology

## 2021-10-02 NOTE — Telephone Encounter (Signed)
Pt called in to see if there was a sooner appt than 11/21. I checked the schedule and an appt had opened on 11/10 with Dr. Bertis Ruddy. I r/s pt's new hem appt to the earlier date, pt is aware.

## 2021-10-08 ENCOUNTER — Encounter: Payer: Self-pay | Admitting: Hematology and Oncology

## 2021-10-08 ENCOUNTER — Inpatient Hospital Stay: Payer: 59 | Attending: Hematology and Oncology | Admitting: Hematology and Oncology

## 2021-10-08 ENCOUNTER — Inpatient Hospital Stay: Payer: 59

## 2021-10-08 ENCOUNTER — Other Ambulatory Visit: Payer: Self-pay

## 2021-10-08 DIAGNOSIS — D696 Thrombocytopenia, unspecified: Secondary | ICD-10-CM | POA: Diagnosis present

## 2021-10-08 DIAGNOSIS — Z87891 Personal history of nicotine dependence: Secondary | ICD-10-CM | POA: Insufficient documentation

## 2021-10-08 LAB — CMP (CANCER CENTER ONLY)
ALT: 12 U/L (ref 0–44)
AST: 13 U/L — ABNORMAL LOW (ref 15–41)
Albumin: 3.8 g/dL (ref 3.5–5.0)
Alkaline Phosphatase: 68 U/L (ref 38–126)
Anion gap: 9 (ref 5–15)
BUN: 7 mg/dL (ref 6–20)
CO2: 21 mmol/L — ABNORMAL LOW (ref 22–32)
Calcium: 9.2 mg/dL (ref 8.9–10.3)
Chloride: 107 mmol/L (ref 98–111)
Creatinine: 0.7 mg/dL (ref 0.44–1.00)
GFR, Estimated: >60 mL/min (ref >60)
Glucose, Bld: 83 mg/dL (ref 70–99)
Potassium: 3.7 mmol/L (ref 3.5–5.1)
Sodium: 137 mmol/L (ref 135–145)
Total Bilirubin: 0.7 mg/dL (ref 0.3–1.2)
Total Protein: 8.1 g/dL (ref 6.5–8.1)

## 2021-10-08 LAB — CBC WITH DIFFERENTIAL/PLATELET
Abs Immature Granulocytes: 0.01 10*3/uL (ref 0.00–0.07)
Basophils Absolute: 0.1 10*3/uL (ref 0.0–0.1)
Basophils Relative: 1 %
Eosinophils Absolute: 0 10*3/uL (ref 0.0–0.5)
Eosinophils Relative: 0 %
HCT: 37.5 % (ref 36.0–46.0)
Hemoglobin: 12.3 g/dL (ref 12.0–15.0)
Immature Granulocytes: 0 %
Lymphocytes Relative: 43 %
Lymphs Abs: 3.1 10*3/uL (ref 0.7–4.0)
MCH: 27.5 pg (ref 26.0–34.0)
MCHC: 32.8 g/dL (ref 30.0–36.0)
MCV: 83.9 fL (ref 80.0–100.0)
Monocytes Absolute: 0.7 10*3/uL (ref 0.1–1.0)
Monocytes Relative: 10 %
Neutro Abs: 3.4 10*3/uL (ref 1.7–7.7)
Neutrophils Relative %: 46 %
Platelets: ADEQUATE 10*3/uL (ref 150–400)
RBC: 4.47 MIL/uL (ref 3.87–5.11)
RDW: 12.6 % (ref 11.5–15.5)
Smear Review: ADEQUATE
WBC: 7.2 10*3/uL (ref 4.0–10.5)
nRBC: 0 % (ref 0.0–0.2)

## 2021-10-08 LAB — SEDIMENTATION RATE: Sed Rate: 35 mm/hr — ABNORMAL HIGH (ref 0–22)

## 2021-10-08 LAB — ABO/RH: ABO/RH(D): O POS

## 2021-10-08 LAB — HIV ANTIBODY (ROUTINE TESTING W REFLEX): HIV Screen 4th Generation wRfx: NONREACTIVE

## 2021-10-08 LAB — SAVE SMEAR(SSMR), FOR PROVIDER SLIDE REVIEW

## 2021-10-08 LAB — HEPATITIS B SURFACE ANTIGEN: Hepatitis B Surface Ag: NONREACTIVE

## 2021-10-08 LAB — VITAMIN B12: Vitamin B-12: 411 pg/mL (ref 180–914)

## 2021-10-09 LAB — HEPATITIS C ANTIBODY: HCV Ab: NONREACTIVE

## 2021-10-09 LAB — ANTINUCLEAR ANTIBODIES, IFA: ANA Ab, IFA: NEGATIVE

## 2021-10-09 LAB — HEPATITIS B SURFACE ANTIBODY,QUALITATIVE: Hep B S Ab: NONREACTIVE

## 2021-10-09 LAB — HEPATITIS B CORE ANTIBODY, IGM: Hep B C IgM: NONREACTIVE

## 2021-10-09 NOTE — Progress Notes (Signed)
[facility] CONSULT NOTE  Patient Care Team: Tisovec, Adelfa Koh, MD as PCP - General (Internal Medicine)  ASSESSMENT & PLAN Thrombocytopenia [facility]) The most likely cause of her thrombocytopenia is platelet clumping, verified by myself through review of peripheral blood smear I have ordered additional work-up to exclude other causes of ITP I have set up virtual visit next week to go through test results with her At this point in time, unless other test results came back abnormal, she does not need long-term follow-up  Orders Placed This Encounter  Procedures   CBC with Differential/Platelet    Standing Status:   Standing    Number of Occurrences:   22    Standing Expiration Date:   10/08/2022   Vitamin B12    Standing Status:   Future    Number of Occurrences:   1    Standing Expiration Date:   10/08/2022   Sedimentation rate    Standing Status:   Future    Number of Occurrences:   1    Standing Expiration Date:   10/08/2022   CMP (Cancer Center only)    Standing Status:   Future    Number of Occurrences:   1    Standing Expiration Date:   10/08/2022   Hepatitis C antibody    Standing Status:   Future    Number of Occurrences:   1    Standing Expiration Date:   10/08/2022   HIV antibody (with reflex)    Standing Status:   Future    Number of Occurrences:   1    Standing Expiration Date:   10/08/2022   ANA, IFA (with reflex)    Standing Status:   Future    Number of Occurrences:   1    Standing Expiration Date:   10/08/2022   Save Smear (SSMR)    Standing Status:   Future    Number of Occurrences:   1    Standing Expiration Date:   10/08/2022   Hepatitis B surface antibody,qualitative    Standing Status:   Future    Number of Occurrences:   1    Standing Expiration Date:   10/08/2022   Hepatitis B surface antigen    Standing Status:   Future    Number of Occurrences:   1    Standing Expiration Date:   10/08/2022   Hepatitis B core antibody, IgM     Standing Status:   Future    Number of Occurrences:   1    Standing Expiration Date:   10/08/2022   ABO/Rh    Standing Status:   Future    Number of Occurrences:   1    Standing Expiration Date:   10/08/2022   All questions were answered. The patient knows to call the clinic with any problems, questions or concerns. No barriers to learning was detected. The total time spent in the appointment was 50 minutes encounter with patients including review of chart and various tests results, discussions about plan of care and coordination of care plan  Artis Delay, MD 10/09/2021 1:01 PM  CHIEF COMPLAINTS/PURPOSE OF CONSULTATION:  Thrombocytopenia  HISTORY OF PRESENTING ILLNESS:  Leslie Adams 30 y.o. female is here because of thrombocytopenia.  She was found to have abnormal CBC from recent routine blood work According to the patient, around October 24, her platelet count was 76,000 That was repeated a week later by her primary care doctor and she was told it  was 56,000 She denies recent bruising/bleeding, such as spontaneous epistaxis, hematuria, melena or hematochezia She denies recent excessive menorrhagia The patient denies history of liver disease, exposure to heparin, history of cardiac murmur/prior cardiovascular surgery or recent new medications She denies prior blood or platelet transfusions She had history of hidradenitis suppurative and had been on multiple different antibiotics in the past but none lately Her dermatologist is in the process of prescribing Humira The typical distribution of her HS is usually in the groin, breast and at the skin fold region  MEDICAL HISTORY:  Past Medical History:  Diagnosis Date   Abscess of axilla, right    UTI (lower urinary tract infection)     SURGICAL HISTORY: History reviewed. No pertinent surgical history.  SOCIAL HISTORY: Social History   Socioeconomic History   Marital status: Single    Spouse name: Not on file   Number  of children: Not on file   Years of education: Not on file   Highest education level: Not on file  Occupational History   Occupation: accountant  Tobacco Use   Smoking status: Former    Packs/day: 0.25    Types: Cigarettes    Quit date: 02/06/2012    Years since quitting: 9.6   Smokeless tobacco: Never  Substance and Sexual Activity   Alcohol use: Yes   Drug use: No   Sexual activity: Not on file  Other Topics Concern   Not on file  Social History Narrative   Not on file   Social Determinants of Health   Financial Resource Strain: Not on file  Food Insecurity: Not on file  Transportation Needs: Not on file  Physical Activity: Not on file  Stress: Not on file  Social Connections: Not on file  Intimate Partner Violence: Not on file    FAMILY HISTORY: Family History  Problem Relation Age of Onset   Diabetes Father     ALLERGIES:  is allergic to fish allergy.  MEDICATIONS:  Current Outpatient Medications  Medication Sig Dispense Refill   acetaminophen (TYLENOL) 500 MG tablet Take 500 mg by mouth every 6 (six) hours as needed.     No current facility-administered medications for this visit.    REVIEW OF SYSTEMS:   Constitutional: Denies fevers, chills or abnormal night sweats Eyes: Denies blurriness of vision, double vision or watery eyes Ears, nose, mouth, throat, and face: Denies mucositis or sore throat Respiratory: Denies cough, dyspnea or wheezes Cardiovascular: Denies palpitation, chest discomfort or lower extremity swelling Gastrointestinal:  Denies nausea, heartburn or change in bowel habits Skin: Denies abnormal skin rashes Lymphatics: Denies new lymphadenopathy or easy bruising Neurological:Denies numbness, tingling or new weaknesses Behavioral/Psych: Mood is stable, no new changes  All other systems were reviewed with the patient and are negative.  PHYSICAL EXAMINATION: ECOG PERFORMANCE STATUS: 0 - Asymptomatic  Vitals:   10/08/21 1322  BP:  135/75  Pulse: (!) 102  Resp: 18  Temp: 98.3 F (36.8 C)  SpO2: 100%   Filed Weights   10/08/21 1322  Weight: 245 lb 12.8 oz (111.5 kg)    GENERAL:alert, no distress and comfortable SKIN: skin color, texture, turgor are normal, no rashes or significant lesions.  Noted skin piercing and tattoos EYES: normal, conjunctiva are pink and non-injected, sclera clear OROPHARYNX:no exudate, no erythema and lips, buccal mucosa, and tongue normal  NECK: supple, thyroid normal size, non-tender, without nodularity LYMPH:  no palpable lymphadenopathy in the cervical, axillary or inguinal LUNGS: clear to auscultation and percussion with normal  breathing effort HEART: regular rate & rhythm and no murmurs and no lower extremity edema ABDOMEN:abdomen soft, non-tender and normal bowel sounds Musculoskeletal:no cyanosis of digits and no clubbing  PSYCH: alert & oriented x 3 with fluent speech NEURO: no focal motor/sensory deficits  LABORATORY DATA:  I have reviewed the data as listed Lab Results  Component Value Date   WBC 7.2 10/08/2021   HGB 12.3 10/08/2021   HCT 37.5 10/08/2021   MCV 83.9 10/08/2021   PLT  10/08/2021    PLATELET CLUMPS NOTED ON SMEAR, COUNT APPEARS ADEQUATE   I have reviewed her peripheral blood smear which show platelet clumping throughout

## 2021-10-09 NOTE — Assessment & Plan Note (Signed)
The most likely cause of her thrombocytopenia is platelet clumping, verified by myself through review of peripheral blood smear I have ordered additional work-up to exclude other causes of ITP I have set up virtual visit next week to go through test results with her At this point in time, unless other test results came back abnormal, she does not need long-term follow-up

## 2021-10-12 ENCOUNTER — Encounter: Payer: Self-pay | Admitting: Hematology and Oncology

## 2021-10-12 ENCOUNTER — Telehealth: Payer: Self-pay | Admitting: *Deleted

## 2021-10-12 ENCOUNTER — Telehealth (HOSPITAL_BASED_OUTPATIENT_CLINIC_OR_DEPARTMENT_OTHER): Payer: 59 | Admitting: Hematology and Oncology

## 2021-10-12 DIAGNOSIS — D696 Thrombocytopenia, unspecified: Secondary | ICD-10-CM | POA: Diagnosis not present

## 2021-10-12 NOTE — Assessment & Plan Note (Signed)
I explained to the patient the cause of her thrombocytopenia is due to platelet clumping All her other tests came back unremarkable There is no contraindication for her to proceed with Humira treatment for her hidradenitis suppurativa She does not need long-term follow-up I will send a letter to her dermatologist about this

## 2021-10-12 NOTE — Progress Notes (Signed)
HEMATOLOGY-ONCOLOGY ELECTRONIC VISIT PROGRESS NOTE  Patient Care Team: Tisovec, Adelfa Koh, MD as PCP - General (Internal Medicine)  I connected with the patient via telephone conference and verified that I am speaking with the correct person using two identifiers. The patient's location is at home and I am providing care from the [facility] I discussed the limitations, risks, security and privacy concerns of performing an evaluation and management service by e-visits and the availability of in person appointments.  I also discussed with the patient that there may be a patient responsible charge related to this service. The patient expressed understanding and agreed to proceed.   ASSESSMENT & PLAN:  Thrombocytopenia (HCC) I explained to the patient the cause of her thrombocytopenia is due to platelet clumping All her other tests came back unremarkable There is no contraindication for her to proceed with Humira treatment for her hidradenitis suppurativa She does not need long-term follow-up I will send a letter to her dermatologist about this  No orders of the defined types were placed in this encounter.   INTERVAL HISTORY: Please see below for problem oriented charting. The purpose of today's discussion is to review test results due to recent findings of thrombocytopenia She have no new symptoms  SUMMARY OF ONCOLOGIC HISTORY: Leslie Adams 30 y.o. female is here because of thrombocytopenia.  She was found to have abnormal CBC from recent routine blood work According to the patient, around October 24, her platelet count was 76,000 That was repeated a week later by her primary care doctor and she was told it was 56,000 She denies recent bruising/bleeding, such as spontaneous epistaxis, hematuria, melena or hematochezia She denies recent excessive menorrhagia The patient denies history of liver disease, exposure to heparin, history of cardiac murmur/prior cardiovascular  surgery or recent new medications She denies prior blood or platelet transfusions She had history of hidradenitis suppurative and had been on multiple different antibiotics in the past but none lately Her dermatologist is in the process of prescribing Humira The typical distribution of her HS is usually in the groin, breast and at the skin fold region On October 08, 2021, she underwent extensive evaluation.  The cause of her thrombocytopenia is due to platelet clumping  REVIEW OF SYSTEMS:   Constitutional: Denies fevers, chills or abnormal weight loss Eyes: Denies blurriness of vision Ears, nose, mouth, throat, and face: Denies mucositis or sore throat Respiratory: Denies cough, dyspnea or wheezes Cardiovascular: Denies palpitation, chest discomfort Gastrointestinal:  Denies nausea, heartburn or change in bowel habits Skin: Denies abnormal skin rashes Lymphatics: Denies new lymphadenopathy or easy bruising Neurological:Denies numbness, tingling or new weaknesses Behavioral/Psych: Mood is stable, no new changes  Extremities: No lower extremity edema All other systems were reviewed with the patient and are negative.  I have reviewed the past medical history, past surgical history, social history and family history with the patient and they are unchanged from previous note.  ALLERGIES:  is allergic to fish allergy.  MEDICATIONS:  Current Outpatient Medications  Medication Sig Dispense Refill   acetaminophen (TYLENOL) 500 MG tablet Take 500 mg by mouth every 6 (six) hours as needed.     No current facility-administered medications for this visit.    PHYSICAL EXAMINATION: ECOG PERFORMANCE STATUS: 0 - Asymptomatic  LABORATORY DATA:  I have reviewed the data as listed CMP Latest Ref Rng & Units 10/08/2021  Glucose 70 - 99 mg/dL 83  BUN 6 - 20 mg/dL 7  Creatinine 3.76 - 2.83 mg/dL 1.51  Sodium 135 - 145 mmol/L 137  Potassium 3.5 - 5.1 mmol/L 3.7  Chloride 98 - 111 mmol/L 107   CO2 22 - 32 mmol/L 21(L)  Calcium 8.9 - 10.3 mg/dL 9.2  Total Protein 6.5 - 8.1 g/dL 8.1  Total Bilirubin 0.3 - 1.2 mg/dL 0.7  Alkaline Phos 38 - 126 U/L 68  AST 15 - 41 U/L 13(L)  ALT 0 - 44 U/L 12    Lab Results  Component Value Date   WBC 7.2 10/08/2021   HGB 12.3 10/08/2021   HCT 37.5 10/08/2021   MCV 83.9 10/08/2021   PLT  10/08/2021    PLATELET CLUMPS NOTED ON SMEAR, COUNT APPEARS ADEQUATE   NEUTROABS 3.4 10/08/2021    I discussed the assessment and treatment plan with the patient. The patient was provided an opportunity to ask questions and all were answered. The patient agreed with the plan and demonstrated an understanding of the instructions. The patient was advised to call back or seek an in-person evaluation if the symptoms worsen or if the condition fails to improve as anticipated.    I spent 10 minutes for the appointment reviewing test results, discuss management and coordination of care.  Heath Lark, MD 10/12/2021 1:53 PM

## 2021-10-12 NOTE — Telephone Encounter (Signed)
Faxed letter to Dr. Chauncey Reading @ [facility] signed by Dr. Bertis Ruddy r/t patient receiving Humira.   Letter in chart.

## 2021-10-19 ENCOUNTER — Other Ambulatory Visit: Payer: Self-pay

## 2021-10-19 ENCOUNTER — Encounter: Payer: Self-pay | Admitting: Hematology and Oncology

## 2022-08-11 ENCOUNTER — Other Ambulatory Visit: Payer: Self-pay

## 2022-08-11 ENCOUNTER — Emergency Department (HOSPITAL_COMMUNITY)
Admission: EM | Admit: 2022-08-11 | Discharge: 2022-08-11 | Disposition: A | Discharge status: Home / Self Care | Payer: 59 | Attending: Emergency Medicine | Admitting: Emergency Medicine

## 2022-08-11 DIAGNOSIS — B349 Viral infection, unspecified: Secondary | ICD-10-CM | POA: Insufficient documentation

## 2022-08-11 DIAGNOSIS — Z87891 Personal history of nicotine dependence: Secondary | ICD-10-CM | POA: Diagnosis not present

## 2022-08-11 DIAGNOSIS — M791 Myalgia, unspecified site: Secondary | ICD-10-CM | POA: Diagnosis present

## 2022-08-11 DIAGNOSIS — U071 COVID-19: Secondary | ICD-10-CM | POA: Diagnosis not present

## 2022-08-11 LAB — SARS CORONAVIRUS 2 BY RT PCR: SARS Coronavirus 2 by RT PCR: POSITIVE — AB

## 2022-08-11 MED ORDER — METHOCARBAMOL 500 MG PO TABS: 500.0000 mg | ORAL_TABLET | Freq: Three times a day (TID) | ORAL | 0 refills | Status: DC | PRN

## 2022-08-11 MED ORDER — METHOCARBAMOL 500 MG PO TABS: 500.0000 mg | ORAL_TABLET | Freq: Two times a day (BID) | ORAL | 0 refills | Status: DC

## 2022-08-11 MED ORDER — NAPROXEN 500 MG PO TABS: 500.0000 mg | ORAL_TABLET | Freq: Two times a day (BID) | ORAL | 0 refills | Status: DC

## 2022-08-11 NOTE — Discharge Instructions (Signed)
You were evaluated in the Emergency Department and after careful evaluation, we did not find any emergent condition requiring admission or further testing in the hospital.  Your exam/testing today was overall reassuring.  Symptoms likely due to a viral illness, COVID-19 is suspected.  Follow-up on your COVID testing tomorrow on MyChart.  If positive recommend isolation per CDC recommendations.  You can use the Naprosyn twice daily for pain, can use the Robaxin for more significant pain.  Please return to the Emergency Department if you experience any worsening of your condition.  Thank you for allowing [place] to be a part of your care.

## 2022-08-11 NOTE — ED Triage Notes (Signed)
Per patient n/v, chills, bodyaches, headache, sore throat x1 day

## 2022-08-11 NOTE — ED Provider Notes (Signed)
[facility] Emergency Department Provider Note MRN:  [id number]  Arrival date & time: 08/11/22     Chief Complaint   Generalized Body Aches   History of Present Illness   Leslie Adams is a 31 y.o. year-old female with no pertinent past medical history presenting to the ED with chief complaint of body aches.  Body aches, nausea, subjective fever, sore throat, headache.  Thinks she may have COVID.  No chest pain or shortness of breath, no abdominal pain, no burning with urination, no rash.  Review of Systems  A thorough review of systems was obtained and all systems are negative except as noted in the HPI and PMH.   Patient's Health History    Past Medical History:  Diagnosis Date   Abscess of axilla, right    UTI (lower urinary tract infection)     No past surgical history on file.  Family History  Problem Relation Age of Onset   Diabetes Father     Social History   Socioeconomic History   Marital status: Single    Spouse name: Not on file   Number of children: Not on file   Years of education: Not on file   Highest education level: Not on file  Occupational History   Occupation: accountant  Tobacco Use   Smoking status: Former    Packs/day: 0.25    Types: Cigarettes    Quit date: 02/06/2012    Years since quitting: 10.5   Smokeless tobacco: Never  Substance and Sexual Activity   Alcohol use: Yes   Drug use: No   Sexual activity: Not on file  Other Topics Concern   Not on file  Social History Narrative   Not on file   Social Determinants of Health   Financial Resource Strain: Not on file  Food Insecurity: Not on file  Transportation Needs: Not on file  Physical Activity: Not on file  Stress: Not on file  Social Connections: Not on file  Intimate Partner Violence: Not on file     Physical Exam   Vitals:   08/11/22 0155  BP: (!) 165/110  Pulse: (!) 106  Resp: 18  Temp: 100.3 F (37.9 C)  SpO2: 97%     CONSTITUTIONAL: Well-appearing, NAD NEURO/PSYCH:  Alert and oriented x 3, no focal deficits EYES:  eyes equal and reactive ENT/NECK:  no LAD, no JVD CARDIO: Regular rate, well-perfused, normal S1 and S2 PULM:  CTAB no wheezing or rhonchi GI/GU:  non-distended, non-tender MSK/SPINE:  No gross deformities, no edema SKIN:  no rash, atraumatic   *Additional and/or pertinent findings included in MDM below  Diagnostic and Interventional Summary    EKG Interpretation  Date/Time:    Ventricular Rate:    PR Interval:    QRS Duration:   QT Interval:    QTC Calculation:   R Axis:     Text Interpretation:         Labs Reviewed  SARS CORONAVIRUS 2 BY RT PCR    No orders to display    Medications - No data to display   Procedures  /  Critical Care Procedures  ED Course and Medical Decision Making  Initial Impression and Ddx Patient is very well-appearing with reassuring vital signs, mild tachycardia in the setting of likely fever.  Feels warm, oral temp 100.3.  She has no meningismus, lungs clear, benign abdomen.  The myriad of symptoms is suggestive of viral illness.  Oropharynx overall benign.  COVID most  likely, will swab.  Patient otherwise healthy, likely would not benefit from Paxlovid.  Appropriate for discharge.  Past medical/surgical history that increases complexity of ED encounter: None  Interpretation of Diagnostics COVID test pending  Patient Reassessment and Ultimate Disposition/Management     Discharge  Patient management required discussion with the following services or consulting groups:  None  Complexity of Problems Addressed Acute complicated illness or Injury  Additional Data Reviewed and Analyzed Further history obtained from: None  Additional Factors Impacting ED Encounter Risk None  Barth Kirks. Sedonia Small, MD [place] [email or web address]  Final Clinical Impressions(s) / ED Diagnoses      ICD-10-CM   1. Viral illness  B34.9       ED Discharge Orders          Ordered    naproxen (NAPROSYN) 500 MG tablet  2 times daily        08/11/22 0234    methocarbamol (ROBAXIN) 500 MG tablet  Every 8 hours PRN        08/11/22 0234             Discharge Instructions Discussed with and Provided to Patient:    Discharge Instructions      You were evaluated in the Emergency Department and after careful evaluation, we did not find any emergent condition requiring admission or further testing in the hospital.  Your exam/testing today was overall reassuring.  Symptoms likely due to a viral illness, COVID-19 is suspected.  Follow-up on your COVID testing tomorrow on MyChart.  If positive recommend isolation per CDC recommendations.  You can use the Naprosyn twice daily for pain, can use the Robaxin for more significant pain.  Please return to the Emergency Department if you experience any worsening of your condition.  Thank you for allowing [place] to be a part of your care.       Maudie Flakes, MD 08/11/22 [phone]

## 2023-04-07 ENCOUNTER — Ambulatory Visit: Payer: 59 | Admitting: Family Medicine

## 2023-04-08 NOTE — Progress Notes (Unsigned)
HPI: [name] is a 32 y.o. female, who is here today to establish care.  Former PCP: N/A Last preventive routine visit: 02/2019.  She sees her gynecologist annually, reports last pap smear in 2023.  She has not had her annual physical examination this year, which she typically schedules around April every year and has not done so since 2020.  She has a history of thrombocytopenia, she follows with hematologist, Dr. Bertis Ruddy.  She quit smoking 2 weeks ago after smoking for approximately 12 years.  Her current exercise routine is walking her dog twice daily, covering at least a mile each time, totaling around an hour to an hour and fifteen minutes of walking per day.  She admits to eating poorly, typically consuming one "high calorie" meal per day around mid-afternoon, which often consists of pizza or [place] food. She does not eat anything after this meal. She does not drink alcohol frequently.  She has a family history of diabetes, with her father being a diabetic and a double amputee due to complications from the disease and her younger brother also having diabetes. Her mother and other siblings are reported to be healthy.  She has a concern about recurrent skin abscesses, especially in between and under her breasts. She has been diagnosed with recurrent hidradenitis suppurativa, which she finds distressing. She is interested in exploring the possibility of a breast reduction to manage this condition and inquiries about a referral to a Engineer, petroleum. She has followed with dermatologist, last visit this year. According to pt, her dermatologist recommended prophylactic treatment, she does not recall the name of the medication but believes it is also used for hypertension (?spironolactone).She has not started medication due to concerns about possible side effects.  Lastly, she discusses her interest in weight management solutions, including the possibility of weight loss  medications.  Review of Systems  Constitutional:  Negative for appetite change, fatigue and fever.  HENT:  Negative for hearing loss, mouth sores, sore throat and trouble swallowing.   Eyes:  Negative for redness and visual disturbance.  Respiratory:  Negative for cough, shortness of breath and wheezing.   Cardiovascular:  Negative for chest pain and leg swelling.  Gastrointestinal:  Negative for abdominal pain, nausea and vomiting.       No changes in bowel habits.  Endocrine: Negative for cold intolerance, heat intolerance, polydipsia, polyphagia and polyuria.  Genitourinary:  Negative for decreased urine volume, dysuria, hematuria, vaginal bleeding and vaginal discharge.  Musculoskeletal:  Negative for arthralgias, gait problem and myalgias.  Skin:  Negative for color change.  Allergic/Immunologic: Positive for environmental allergies.  Neurological:  Negative for syncope, weakness and headaches.  Hematological:  Negative for adenopathy. Does not bruise/bleed easily.  Psychiatric/Behavioral:  Negative for confusion and sleep disturbance. The patient is not nervous/anxious.   All other systems reviewed and are negative. See other pertinent positives and negatives in HPI.  No current outpatient medications on file prior to visit.   No current facility-administered medications on file prior to visit.   Past Medical History:  Diagnosis Date   Abscess of axilla, right    UTI (lower urinary tract infection)    Allergies  Allergen Reactions   Fish Allergy    Other Rash and Swelling    Family History  Problem Relation Age of Onset   Diabetes Father    Social History   Socioeconomic History   Marital status: Single    Spouse name: Not on file   Number of  children: Not on file   Years of education: Not on file   Highest education level: Not on file  Occupational History   Occupation: accountant  Tobacco Use   Smoking status: Former    Packs/day: .25    Types: Cigarettes     Quit date: 02/06/2012    Years since quitting: 11.1   Smokeless tobacco: Never  Substance and Sexual Activity   Alcohol use: Yes   Drug use: No   Sexual activity: Not on file  Other Topics Concern   Not on file  Social History Narrative   Not on file   Social Determinants of Health   Financial Resource Strain: Not on file  Food Insecurity: Not on file  Transportation Needs: Not on file  Physical Activity: Not on file  Stress: Not on file  Social Connections: Not on file   Vitals:   04/11/23 1400  BP: 126/80  Pulse: 91  Resp: 12  Temp: 98.7 F (37.1 C)  SpO2: 99%   Wt Readings from Last 3 Encounters:  04/11/23 257 lb 6 oz (116.7 kg)  08/11/22 250 lb (113.4 kg)  10/08/21 245 lb 12.8 oz (111.5 kg)   Body mass index is 41.54 kg/m.  Physical Exam Vitals and nursing note reviewed.  Constitutional:      General: She is not in acute distress.    Appearance: She is well-developed.  HENT:     Head: Normocephalic and atraumatic.     Right Ear: Hearing, tympanic membrane, ear canal and external ear normal.     Left Ear: Hearing, tympanic membrane, ear canal and external ear normal.     Mouth/Throat:     Mouth: Mucous membranes are moist.     Pharynx: Oropharynx is clear. Uvula midline.  Eyes:     Extraocular Movements: Extraocular movements intact.     Conjunctiva/sclera: Conjunctivae normal.     Pupils: Pupils are equal, round, and reactive to light.  Neck:     Thyroid: No thyromegaly.     Trachea: No tracheal deviation.  Cardiovascular:     Rate and Rhythm: Normal rate and regular rhythm.     Pulses:          Dorsalis pedis pulses are 2+ on the right side and 2+ on the left side.     Heart sounds: No murmur heard. Pulmonary:     Effort: Pulmonary effort is normal. No respiratory distress.     Breath sounds: Normal breath sounds.  Abdominal:     Palpations: Abdomen is soft. There is no hepatomegaly or mass.     Tenderness: There is no abdominal tenderness.   Genitourinary:    Comments: Deferred to gyn. Musculoskeletal:     Comments: No major deformity or signs of synovitis appreciated.  Lymphadenopathy:     Cervical: No cervical adenopathy.     Upper Body:     Right upper body: No supraclavicular adenopathy.     Left upper body: No supraclavicular adenopathy.  Skin:    General: Skin is warm.     Findings: No erythema or rash.          Comments: 1.5 cm abscess that recently drained, not tender, no local heat or induration on inner lower quadrant of left breast. Postinflammatory pigmentation changes around breast and on abdomen.   Neurological:     General: No focal deficit present.     Mental Status: She is alert and oriented to person, place, and time.     Cranial  Nerves: No cranial nerve deficit.     Coordination: Coordination normal.     Gait: Gait normal.     Deep Tendon Reflexes:     Reflex Scores:      Bicep reflexes are 2+ on the right side and 2+ on the left side.      Patellar reflexes are 2+ on the right side and 2+ on the left side. Psychiatric:        Mood and Affect: Mood and affect normal.   ASSESSMENT AND PLAN: Ms. Holtgrewe was seen today for establish care and annual exam.  Diagnoses and all orders for this visit:  Routine general medical examination at a health care facility Assessment & Plan: We discussed the importance of regular physical activity and healthy diet for prevention of chronic illness and/or complications. Preventive guidelines reviewed. Vaccination up to date. She sees her gyn annually. We decided to hold on having blood work done today because she is going to [facility] with [facility], where she is going to have labs done. Next CPE in a year.   Breast hypertrophy in female -     Ambulatory referral to Plastic Surgery  Obesity, Class III, BMI 40-49.9 (morbid obesity) (HCC) Assessment & Plan: She understands the benefits of wt loss as well as adverse effects of  obesity. Consistency with healthy diet and physical activity encouraged. She would like to establish with healthy wt clinic, referral placed.  Orders: -     Amb Ref to Medical Weight Management  Thrombocytopenia [facility]) Assessment & Plan: She follows with hematologist, last visit 09/2021.   Suppurative hidradenitis-recurrent Assessment & Plan: She follows with dermatologist, who she has seen recently. She does not recall medication that was recommended but I think it was Spironolactone. We discussed some side effects, recommend to consider trying medication. Bleach baths and using an antibacterial soap may help to decrease frequency of episodes. Wt loss may also help.   Return in 1 year (on 04/10/2024) for CPE.  Moataz Tavis G. [place], MD  [facility]. [place] office.

## 2023-04-11 ENCOUNTER — Encounter: Payer: Self-pay | Admitting: Family Medicine

## 2023-04-11 ENCOUNTER — Ambulatory Visit: Payer: 59 | Admitting: Family Medicine

## 2023-04-11 VITALS — BP 126/80 | HR 91 | Temp 98.7°F | Resp 12 | Ht 66.0 in | Wt 257.4 lb

## 2023-04-11 DIAGNOSIS — D696 Thrombocytopenia, unspecified: Secondary | ICD-10-CM

## 2023-04-11 DIAGNOSIS — Z0000 Encounter for general adult medical examination without abnormal findings: Secondary | ICD-10-CM

## 2023-04-11 DIAGNOSIS — E66813 Obesity, class 3: Secondary | ICD-10-CM | POA: Insufficient documentation

## 2023-04-11 DIAGNOSIS — N62 Hypertrophy of breast: Secondary | ICD-10-CM

## 2023-04-11 DIAGNOSIS — L732 Hidradenitis suppurativa: Secondary | ICD-10-CM | POA: Insufficient documentation

## 2023-04-11 NOTE — Patient Instructions (Addendum)
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 Do not use My Chart to request refills or for acute issues that need immediate attention. If you send a my chart message, it may take a few days to be addressed, specially if I am not in the office.  Please be sure medication list is accurate. If a new problem present, please set up appointment sooner than planned today.  Health Maintenance, Female Adopting a healthy lifestyle and getting preventive care are important in promoting health and wellness. Ask your health care provider about: The right schedule for you to have regular tests and exams. Things you can do on your own to prevent diseases and keep yourself healthy. What should I know about diet, weight, and exercise? Eat a healthy diet  Eat a diet that includes plenty of vegetables, fruits, low-fat dairy products, and lean protein. Do not eat a lot of foods that are high in solid fats, added sugars, or sodium. Maintain a healthy weight Body mass index (BMI) is used to identify weight problems. It estimates body fat based on height and weight. Your health care provider can help determine your BMI and help you achieve or maintain a healthy weight. Get regular exercise Get regular exercise. This is one of the most important things you can do for your health. Most adults should: Exercise for at least 150 minutes each week. The exercise should increase your heart rate and make you sweat (moderate-intensity exercise). Do strengthening exercises at least twice a week. This is in addition to the moderate-intensity exercise. Spend less time sitting. Even light physical  activity can be beneficial. Watch cholesterol and blood lipids Have your blood tested for lipids and cholesterol at 32 years of age, then have this test every 5 years. Have your cholesterol levels checked more often if: Your lipid or cholesterol levels are high. You are older than 32 years of age. You are at high risk for heart disease. What should I know about cancer screening? Depending on your health history and family history, you may need to have cancer screening at various ages. This may include screening for: Breast cancer. Cervical cancer. Colorectal cancer. Skin cancer. Lung cancer. What should I know about heart disease, diabetes, and high blood pressure? Blood pressure and heart disease High blood pressure causes heart disease and increases the risk of stroke. This is more likely to develop in people who have high blood pressure readings or are overweight. Have your blood pressure checked: Every 3-5 years if you are 89-22 years of age. Every year if you are 4 years old or older. Diabetes Have regular diabetes screenings. This checks your fasting blood sugar level. Have the screening done: Once every three years after age 60 if you are at a normal weight and have a low risk for diabetes. More often and at a younger age if you are overweight or have a high risk for diabetes. What should I know about preventing infection? Hepatitis B If you have a higher risk for hepatitis B, you should be screened for this virus. Talk with your health care provider to find out if you are at risk for hepatitis B infection. Hepatitis C Testing is  recommended for: Everyone born from [birth year] through 1965. Anyone with known risk factors for hepatitis C. Sexually transmitted infections (STIs) Get screened for STIs, including gonorrhea and chlamydia, if: You are sexually active and are younger than 32 years of age. You are older than 32 years of age and your health care provider tells you that you  are at risk for this type of infection. Your sexual activity has changed since you were last screened, and you are at increased risk for chlamydia or gonorrhea. Ask your health care provider if you are at risk. Ask your health care provider about whether you are at high risk for HIV. Your health care provider may recommend a prescription medicine to help prevent HIV infection. If you choose to take medicine to prevent HIV, you should first get tested for HIV. You should then be tested every 3 months for as long as you are taking the medicine. Pregnancy If you are about to stop having your period (premenopausal) and you may become pregnant, seek counseling before you get pregnant. Take 400 to 800 micrograms (mcg) of folic acid every day if you become pregnant. Ask for birth control (contraception) if you want to prevent pregnancy. Osteoporosis and menopause Osteoporosis is a disease in which the bones lose minerals and strength with aging. This can result in bone fractures. If you are 18 years old or older, or if you are at risk for osteoporosis and fractures, ask your health care provider if you should: Be screened for bone loss. Take a calcium or vitamin D supplement to lower your risk of fractures. Be given hormone replacement therapy (HRT) to treat symptoms of menopause. Follow these instructions at home: Alcohol use Do not drink alcohol if: Your health care provider tells you not to drink. You are pregnant, may be pregnant, or are planning to become pregnant. If you drink alcohol: Limit how much you have to: 0-1 drink a day. Know how much alcohol is in your drink. In the [place] one drink equals one 12 oz bottle of beer (355 mL), one 5 oz glass of wine (148 mL), or one 1 oz glass of hard liquor (44 mL). Lifestyle Do not use any products that contain nicotine or tobacco. These products include cigarettes, chewing tobacco, and vaping devices, such as e-cigarettes. If you need help quitting,  ask your health care provider. Do not use street drugs. Do not share needles. Ask your health care provider for help if you need support or information about quitting drugs. General instructions Schedule regular health, dental, and eye exams. Stay current with your vaccines. Tell your health care provider if: You often feel depressed. You have ever been abused or do not feel safe at home. Summary Adopting a healthy lifestyle and getting preventive care are important in promoting health and wellness. Follow your health care provider's instructions about healthy diet, exercising, and getting tested or screened for diseases. Follow your health care provider's instructions on monitoring your cholesterol and blood pressure. This information is not intended to replace advice given to you by your health care provider. Make sure you discuss any questions you have with your health care provider. Document Revised: 04/06/2021 Document Reviewed: 04/06/2021 Elsevier Patient Education  2023 [employer].

## 2023-04-11 NOTE — Assessment & Plan Note (Signed)
She follows with dermatologist, who she has seen recently. She does not recall medication that was recommended but I think it was Spironolactone. We discussed some side effects, recommend to consider trying medication. Bleach baths and using an antibacterial soap may help to decrease frequency of episodes. Wt loss may also help.

## 2023-04-11 NOTE — Assessment & Plan Note (Signed)
We discussed the importance of regular physical activity and healthy diet for prevention of chronic illness and/or complications. Preventive guidelines reviewed. Vaccination up to date. She sees her gyn annually. We decided to hold on having blood work done today because she is going to establish with [facility], where she is going to have labs done. Next CPE in a year.

## 2023-04-11 NOTE — Assessment & Plan Note (Signed)
She understands the benefits of wt loss as well as adverse effects of obesity. Consistency with healthy diet and physical activity encouraged. She would like to establish with healthy wt clinic, referral placed.

## 2023-04-11 NOTE — Assessment & Plan Note (Signed)
She follows with hematologist, last visit 09/2021.

## 2023-04-21 ENCOUNTER — Encounter (INDEPENDENT_AMBULATORY_CARE_PROVIDER_SITE_OTHER): Payer: Self-pay | Admitting: Physician Assistant

## 2023-04-21 ENCOUNTER — Ambulatory Visit (INDEPENDENT_AMBULATORY_CARE_PROVIDER_SITE_OTHER): Payer: 59 | Admitting: Physician Assistant

## 2023-04-21 VITALS — BP 136/89 | HR 96 | Ht 66.0 in | Wt 251.0 lb

## 2023-04-21 DIAGNOSIS — L732 Hidradenitis suppurativa: Secondary | ICD-10-CM | POA: Diagnosis not present

## 2023-04-21 DIAGNOSIS — Z6841 Body Mass Index (BMI) 40.0 and over, adult: Secondary | ICD-10-CM

## 2023-04-21 DIAGNOSIS — Z0289 Encounter for other administrative examinations: Secondary | ICD-10-CM

## 2023-04-21 NOTE — Progress Notes (Signed)
Office: [phone]  /  Fax: [phone]   Initial Visit  Leslie Adams was seen in clinic today to evaluate for obesity. She is interested in losing weight to improve overall health and reduce the risk of weight related complications. She presents today to review program treatment options, initial physical assessment, and evaluation.     She was referred by: PCP  When asked what else they would like to accomplish? She states: Adopt healthier eating patterns, Improve existing medical conditions, Improve quality of life, Improve appearance, and Improve self-confidence  Weight history: Overweight most of her life. Then 5-6 years ago, weight increased to peak of 298 lbs. She has lost to 240's by walking and watching what she eats, but feels stuck.   When asked how has your weight affected you? She states: Has affected self-esteem, Contributed to medical problems, Problems with eating patterns, and Has affected mood   Some associated conditions: Hidradenitis Supprativa  Contributing factors: Family history, Stress, Reduced physical activity, and Eating patterns  Weight promoting medications identified: None  Current nutrition plan: None  Current level of physical activity: Walking her dog 2 times daily  Current or previous pharmacotherapy: None Was prescribed Phentermine but did not fill prescription.  Response to medication: Never tried medications   Past medical history includes:   Past Medical History:  Diagnosis Date   Abscess of axilla, right    UTI (lower urinary tract infection)      Objective:   BP 136/89   Pulse 96   Ht 5\' 6"  (1.676 m)   Wt 251 lb (113.9 kg)   LMP 03/30/2023   SpO2 100%   BMI 40.51 kg/m  She was weighed on the bioimpedance scale: Body mass index is 40.51 kg/m.  Peak Weight: 298 lbs , Body Fat%:43.3, Visceral Fat Rating:11, Weight trend over the last 12 months: Unchanged  General:  Alert, oriented and cooperative. Patient is in no  acute distress.  Respiratory: Normal respiratory effort, no problems with respiration noted   Gait: able to ambulate independently  Mental Status: Normal mood and affect. Normal behavior. Normal judgment and thought content.   DIAGNOSTIC DATA REVIEWED:  BMET    Component Value Date/Time   NA 137 10/08/2021 1347   K 3.7 10/08/2021 1347   CL 107 10/08/2021 1347   CO2 21 (L) 10/08/2021 1347   GLUCOSE 83 10/08/2021 1347   BUN 7 10/08/2021 1347   CREATININE 0.70 10/08/2021 1347   CALCIUM 9.2 10/08/2021 1347   GFRNONAA >60 10/08/2021 1347   No results found for: "HGBA1C" No results found for: "INSULIN" CBC    Component Value Date/Time   WBC 7.2 10/08/2021 1347   RBC 4.47 10/08/2021 1347   HGB 12.3 10/08/2021 1347   HCT 37.5 10/08/2021 1347   PLT  10/08/2021 1347    PLATELET CLUMPS NOTED ON SMEAR, COUNT APPEARS ADEQUATE   MCV 83.9 10/08/2021 1347   MCH 27.5 10/08/2021 1347   MCHC 32.8 10/08/2021 1347   RDW 12.6 10/08/2021 1347   Iron/TIBC/Ferritin/ %Sat No results found for: "IRON", "TIBC", "FERRITIN", "IRONPCTSAT" Lipid Panel  No results found for: "CHOL", "TRIG", "HDL", "CHOLHDL", "VLDL", "LDLCALC", "LDLDIRECT" Hepatic Function Panel     Component Value Date/Time   PROT 8.1 10/08/2021 1347   ALBUMIN 3.8 10/08/2021 1347   AST 13 (L) 10/08/2021 1347   ALT 12 10/08/2021 1347   ALKPHOS 68 10/08/2021 1347   BILITOT 0.7 10/08/2021 1347   No results found for: "TSH"   Assessment and  Plan:   Obesity, Class III, BMI 40-49.9 (morbid obesity) (HCC) BMI 40.5  Suppurative hidradenitis-recurrent  Suppurative Hidradenitis: Reports history of hidradenitis and is currently on no treatments. Reports symptoms/cysts improved in past with weight loss. Offered Humira for management in past, but wanted to try to improve things naturally without medications.  Plan:  Will work on healthy eating plan to decrease simple carbohydrates, increase lean proteins and decrease saturated fats,  decrease highly processed foods to promote weight loss and improve the inflammatory nature of the disease process.        Obesity Treatment / Action Plan:  Patient will work on garnering support from family and friends to begin weight loss journey. Will work on eliminating or reducing the presence of highly palatable, calorie dense foods in the home. Will complete provided nutritional and psychosocial assessment questionnaire before the next appointment. Will be scheduled for indirect calorimetry to determine resting energy expenditure in a fasting state.  This will allow [place] to create a reduced calorie, high-protein meal plan to promote loss of fat mass while preserving muscle mass. Will avoid skipping meals which may result in increased hunger signals and overeating at certain times. Counseled on the health benefits of losing 5%-15% of total body weight. Was counseled on nutritional approaches to weight loss and benefits of reducing processed foods and consuming plant-based foods and high quality protein as part of nutritional weight management. Was counseled on pharmacotherapy and role as an adjunct in weight management.   Obesity Education Performed Today:  She was weighed on the bioimpedance scale and results were discussed and documented in the synopsis.  We discussed obesity as a disease and the importance of a more detailed evaluation of all the factors contributing to the disease.  We discussed the importance of long term lifestyle changes which include nutrition, exercise and behavioral modifications as well as the importance of customizing this to her specific health and social needs.  We discussed the benefits of reaching a healthier weight to alleviate the symptoms of existing conditions and reduce the risks of the biomechanical, metabolic and psychological effects of obesity.  Leslie Adams appears to be in the action stage of change and states they are ready to start  intensive lifestyle modifications and behavioral modifications.  30 minutes was spent today on this visit including the above counseling, pre-visit chart review, and post-visit documentation.  Reviewed by clinician on day of visit: allergies, medications, problem list, medical history, surgical history, family history, social history, and previous encounter notes pertinent to obesity diagnosis.   Erika Hussar,PA-C

## 2023-05-10 ENCOUNTER — Encounter (INDEPENDENT_AMBULATORY_CARE_PROVIDER_SITE_OTHER): Payer: Self-pay | Admitting: Family Medicine

## 2023-05-10 ENCOUNTER — Ambulatory Visit (INDEPENDENT_AMBULATORY_CARE_PROVIDER_SITE_OTHER): Payer: 59 | Admitting: Family Medicine

## 2023-05-10 VITALS — BP 148/85 | HR 64 | Temp 98.6°F | Ht 66.0 in | Wt 254.0 lb

## 2023-05-10 DIAGNOSIS — L732 Hidradenitis suppurativa: Secondary | ICD-10-CM | POA: Diagnosis not present

## 2023-05-10 DIAGNOSIS — R030 Elevated blood-pressure reading, without diagnosis of hypertension: Secondary | ICD-10-CM

## 2023-05-10 DIAGNOSIS — F5089 Other specified eating disorder: Secondary | ICD-10-CM | POA: Diagnosis not present

## 2023-05-10 DIAGNOSIS — Z1331 Encounter for screening for depression: Secondary | ICD-10-CM | POA: Diagnosis not present

## 2023-05-10 DIAGNOSIS — Z7289 Other problems related to lifestyle: Secondary | ICD-10-CM

## 2023-05-10 DIAGNOSIS — Z6841 Body Mass Index (BMI) 40.0 and over, adult: Secondary | ICD-10-CM

## 2023-05-10 DIAGNOSIS — R0602 Shortness of breath: Secondary | ICD-10-CM | POA: Diagnosis not present

## 2023-05-10 DIAGNOSIS — F17290 Nicotine dependence, other tobacco product, uncomplicated: Secondary | ICD-10-CM

## 2023-05-10 DIAGNOSIS — R5383 Other fatigue: Secondary | ICD-10-CM

## 2023-05-10 NOTE — Progress Notes (Signed)
Leslie Adams, D.O.  AB[facility], AB[facility] Specializing in Clinical Bariatric Medicine Office located at: [address] [place], [place]  [address]     Initial Bariatric Medicine Consultation Visit  Dear Dr. Betty [place] MD,  Thank you for referring Leslie Adams to our clinic today for evaluation.  We performed a consultation to discuss her options for treatment and educate the patient on her disease state.  The following note includes my evaluation and treatment recommendations.   Please do not hesitate to reach out to me directly if you have any further concerns.    Assessment and Plan:   Orders Placed This Encounter  Procedures   CBC with Differential/Platelet   Comprehensive metabolic panel   Folate   Hemoglobin A1c   Insulin, random   Lipid Panel With LDL/HDL Ratio   T4, free   TSH   Vitamin B12   VITAMIN D 25 Hydroxy (Vit-D Deficiency, Fractures)   EKG 12-Lead    1) Fatigue Assessment: Condition is Not optimized. Leslie Adams does feel that her weight is causing her energy to be lower than it should be. Fatigue may be related to obesity, depression or many other causes. she does not appear to have any red flag symptoms and this appears to most likely related to her current lifestyle habits and dietary intake.  Plan:  Labs will be ordered and reviewed with her at their next office visit in two weeks. Epworth sleepiness scale score appears to be within normal limits.  Her ESS score is 10 .   Leslie Adams admits to  occasional daytime somnolence. Leslie Adams generally gets  8 -10  hours of sleep per night, and states that she has restful sleep. Snoring is not present. Apneic episodes is not present.  ECG: Normal sinus rhythm, rate 72 bpm; reassuring without any acute abnormalities, will continue to monitor for symptoms  Depression Screen/Modified PHQ-9 Depression Screen: Her Food and Mood (modified PHQ-9) score was 15 . Will continue to monitor her feelings.  In the meanwhile,  Leslie Adams will focus on self care including making healthy food choices by following their meal plan, improving sleep quality and focusing on stress reduction.  Once we are assured she is on an appropriate meal plan, we will start discussing exercise to increase cardiovascular fitness levels.     2) Shortness of breath on exertion Assessment: Condition is Not optimized. Leslie Adams does feel that she gets out of breath more easily than she used to when she exercises and seems to be worsening over time with weight gain.  This has gotten worse recently. Leslie Adams denies shortness of breath at rest or orthopnea. Leslie Adams's shortness of breath appears to be obesity related and exercise induced, as they do not appear to have any "red flag" symptoms/ concerns today.  Also, this condition appears to be related to a state of poor cardiovascular conditioning   Plan:  Obtain labs today and will be reviewed with her at their next office visit in two weeks. Indirect Calorimeter completed today to help guide our dietary regimen. It shows a VO2 of 284 and a REE of 1958.  Her calculated basal metabolic rate is 1914 thus her measured basal metabolic rate is better than expected. Patient agreed to work on weight loss at this time.  As [place] progresses through our weight loss program, we will gradually increase exercise as tolerated to treat her current condition.   If Leslie Adams follows our recommendations and loses 5-10% of their weight without improvement of  her shortness of breath or if at any time, symptoms become more concerning, they agree to urgently follow up with their PCP/ specialist for further consideration/ evaluation.   Leslie Adams verbalizes agreement with this plan.     Suppurative hidradenitis-recurrent Assessment: Condition is not optimized. Patient endorses having outbreaks of HS weekly between her legs and in her underarms. She sees a dermatologist and is not taking any medicines for this condition. Pt notes  that her dermatologist recommended blood pressure medication to manage her HS, however Pt is apprehensive about trying the med.   Plan: Check labs. I highly encouraged patient to continue meeting with dermatologist. Discussed with patient that weight loss and having less skin rub may decrease frequency of HS episodes. Will begin to monitor condition as it relates to her weight loss journey.    Other disorder of eating - Emotional Eating Assessment: Condition is Not optimized. Denies any SI/HI. Mood is stable.Pt endorses eating when stressed and bored.   Plan: Check labs.Patient was referred to Dr. Dewaine Conger, our Bariatric Psychologist, for evaluation due to her struggles with emotional eating. Reminded patient of the importance of following their prudent nutrition plan and how food can affect mood as well to support emotional wellbeing. We will begin to monitor closely alongside PCP / other specialists.     Elevated blood pressure reading without diagnosis of hypertension Assessment: Her blood pressure is elevated. Asymptomatic. Reports no chest pain, SOB, palpitations. Patient has never been told she has hypertension. I reviewed ideal blood pressure values with Leslie Adams.   Last 3 blood pressure readings in our office are as follows: BP Readings from Last 3 Encounters:  05/10/23 (!) 148/85  04/21/23 136/89  04/11/23 126/80   Plan: Check labs. Begin with Prudent nutritional plan and low sodium diet, advance exercise as tolerated. Ambulatory blood pressure monitoring encouraged.  Reminded patient that if they ever feel poorly in any way, to check their blood pressure and pulse as well.We will continue to monitor closely alongside PCP/ specialists.  Pt reminded to also f/up with those individuals as instructed by them. We will continue to monitor symptoms as they relate to the her weight loss journey.     Current every day vaping Assessment: Condition is stable. Patient used to smoke half a pack  of cigarettes daily for 10 yrs. She quit last month and has been vaping as a substitute Patient is not sure if her vape contains nicotine and will let me know next OV.   Plan: The patient was counseled on the dangers of vaping and was advised to quit. Patient is interested in quiting in the near future. Will begin to monitor condition.    TREATMENT PLAN FOR OBESITY: Obesity, Class III, BMI 40-49.9 (morbid obesity) (HCC) Assessment: Condition is worsening. Biometric data collected today, was reviewed with patient.  Since information session visit on 04/20/21, Muscle mass has decreased by 9 lb. Fat mass has increased by 12.4 lb.Total body water has decreased by 1 lb.   Plan: Begin the Category 2 meal with lunch options.   Behavioral Intervention Additional resources provided today: category 2 meal plan information and lunch options Evidence-based interventions for health behavior change were utilized today including the discussion of self monitoring techniques, problem-solving barriers and SMART goal setting techniques.   Regarding patient's less desirable eating habits and patterns, we employed the technique of small changes.  Pt will specifically work on: begin prudent nutritional plan for next visit.    Recommended Physical Activity Goals Leslie Adams has  been advised to gradually work up to 150 minutes of moderate intensity aerobic activity a week and strengthening exercises 2-3 times per week for cardiovascular health, weight loss maintenance and preservation of muscle mass.  She has agreed to continue their current level of activity  FOLLOW UP: Follow up in 2 weeks. She was informed of the importance of frequent follow up visits to maximize her success with intensive lifestyle modifications for her multiple health conditions.  Leslie Adams is aware that we will review all of her lab results at our next visit.  She is aware that if anything is critical/ life threatening with the results,  we will be contacting her via MyChart prior to the office visit to discuss management.     Chief Complaint:   OBESITY Leslie Adams (MR# [id number]) is a 32 y.o. female who presents for evaluation and treatment of obesity and related comorbidities. Current BMI is Body mass index is 41 kg/m. Leslie Adams has been struggling with her weight for many years and has been unsuccessful in either losing weight, maintaining weight loss, or reaching her healthy weight goal.  Leslie Adams is currently in the action stage of change and ready to dedicate time achieving and maintaining a healthier weight. Leslie Adams is interested in becoming our patient and working on intensive lifestyle modifications including (but not limited to) diet and exercise for weight loss.  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 Patient is single and does not have children. She lives alone.  Leslie Adams's habits were reviewed today and are as follows:  - Desires to lose 75 lbs in 6 months.   - Walks 30 minutes, 2 days a week.   - Has tried the Keto diet in the past.   - Started eating unhealthy in 2018.  - Eats outside the home 4-5 times a week.   - Her biggest obstacles to cooking is time, being tired after work, and wanting something quick to eat.   - Craves fast-food and pizza.  - Drinks coffee with cream and sugar and tea with sugar.  - Skips breakfast and dinner everyday.   - Worst food habit: "eating a terrible high carb, high calorie meal"  Subjective:   This is the patient's first visit at [facility].  The patient's NEW PATIENT PACKET that they filled out prior to today's office visit was reviewed at length and information from that paperwork was included within the following office visit note.    Included in the packet: current and past health history, medications, allergies, ROS, gynecologic history (women only), surgical history, family history,  social history, weight history, weight loss surgery history (for those that have had weight loss surgery), nutritional evaluation, mood and food questionnaire along with a depression screening (PHQ9) on all patients, an Epworth questionnaire, sleep habits questionnaire, patient life and health improvement goals questionnaire. These will all be scanned into the patient's chart under the "media" tab.   Review of Systems: Please refer to new patient packet scanned into media. Pertinent positives were addressed with patient today.  Objective:   PHYSICAL EXAM: Blood pressure (!) 148/85, pulse 64, temperature 98.6 F (37 C), height 5\' 6"  (1.676 m), weight 254 lb (115.2 kg), last menstrual period 03/30/2023, SpO2 100 %. Body mass index is 41 kg/m. General: Well Developed, well nourished, and in no acute distress.  HEENT: Normocephalic, atraumatic Skin: Warm and dry, cap RF less 2 sec, good turgor Chest:  Normal  excursion, shape, no gross abn Respiratory: speaking in full sentences, no conversational dyspnea NeuroM-Sk: Ambulates w/o assistance, moves * 4 Psych: A and O *3, insight good, mood-full  Anthropometric Measurements Height: 5\' 6"  (1.676 m) Weight: 254 lb (115.2 kg) BMI (Calculated): 41.02 Starting Weight: 254lb Peak Weight: 292lb Waist Measurement : 43 inches   Body Composition  Body Fat %: 47.7 % Fat Mass (lbs): 121.2 lbs Muscle Mass (lbs): 126.2 lbs Total Body Water (lbs): 92.6 lbs Visceral Fat Rating : 12   Other Clinical Data RMR: 1958 Today's Visit #: 1 Starting Date: 05/10/23   DIAGNOSTIC DATA REVIEWED:  BMET    Component Value Date/Time   NA 137 10/08/2021 1347   K 3.7 10/08/2021 1347   CL 107 10/08/2021 1347   CO2 21 (L) 10/08/2021 1347   GLUCOSE 83 10/08/2021 1347   BUN 7 10/08/2021 1347   CREATININE 0.70 10/08/2021 1347   CALCIUM 9.2 10/08/2021 1347   GFRNONAA >60 10/08/2021 1347   No results found for: "HGBA1C" No results found for: "INSULIN" No  results found for: "TSH" CBC    Component Value Date/Time   WBC 7.2 10/08/2021 1347   RBC 4.47 10/08/2021 1347   HGB 12.3 10/08/2021 1347   HCT 37.5 10/08/2021 1347   PLT  10/08/2021 1347    PLATELET CLUMPS NOTED ON SMEAR, COUNT APPEARS ADEQUATE   MCV 83.9 10/08/2021 1347   MCH 27.5 10/08/2021 1347   MCHC 32.8 10/08/2021 1347   RDW 12.6 10/08/2021 1347   Iron Studies No results found for: "IRON", "TIBC", "FERRITIN", "IRONPCTSAT" Lipid Panel  No results found for: "CHOL", "TRIG", "HDL", "CHOLHDL", "VLDL", "LDLCALC", "LDLDIRECT" Hepatic Function Panel     Component Value Date/Time   PROT 8.1 10/08/2021 1347   ALBUMIN 3.8 10/08/2021 1347   AST 13 (L) 10/08/2021 1347   ALT 12 10/08/2021 1347   ALKPHOS 68 10/08/2021 1347   BILITOT 0.7 10/08/2021 1347   No results found for: "TSH" Nutritional No results found for: "VD25OH"  Attestation Statements:   Reviewed by clinician on day of visit: allergies, medications, problem list, medical history, surgical history, family history, social history, and previous encounter notes.  During the visit, I independently reviewed the patient's EKG, bioimpedance scale results, and indirect calorimeter results. I used this information to tailor a meal plan for the patient that will help Leslie Adams to lose weight and will improve her obesity-related conditions going forward.  I performed a medically necessary appropriate examination and/or evaluation. I discussed the assessment and treatment plan with the patient. The patient was provided an opportunity to ask questions and all were answered. The patient agreed with the plan and demonstrated an understanding of the instructions. Labs were ordered today (unless patient declined them) and will be reviewed with the patient at our next visit unless more critical results need to be addressed immediately. Clinical information was updated and documented in the EMR.  Time spent on visit including  pre-visit chart review and post-visit care was estimated to be 60  minutes.   I,Special Puri,acting as a Neurosurgeon for [employer], DO.,have documented all relevant documentation on the behalf of Thomasene Lot, DO,as directed by  Thomasene Lot, DO while in the presence of Thomasene Lot, DO.   I, Thomasene Lot, DO, have reviewed all documentation for this visit. The documentation on 05/10/23 for the exam, diagnosis, procedures, and orders are all accurate and complete.

## 2023-05-11 LAB — CBC WITH DIFFERENTIAL/PLATELET

## 2023-05-12 LAB — CBC WITH DIFFERENTIAL/PLATELET
Basophils Absolute: 0 10*3/uL (ref 0.0–0.2)
Basos: 1 %
EOS (ABSOLUTE): 0.1 10*3/uL (ref 0.0–0.4)
Eos: 2 %
Hematocrit: 40.5 % (ref 34.0–46.6)
Hemoglobin: 12.9 g/dL (ref 11.1–15.9)
Immature Grans (Abs): 0 10*3/uL (ref 0.0–0.1)
Immature Granulocytes: 0 %
Lymphocytes Absolute: 2.5 10*3/uL (ref 0.7–3.1)
Lymphs: 40 %
MCH: 27.7 pg (ref 26.6–33.0)
MCHC: 31.9 g/dL (ref 31.5–35.7)
MCV: 87 fL (ref 79–97)
Monocytes Absolute: 0.5 10*3/uL (ref 0.1–0.9)
Monocytes: 9 %
Neutrophils Absolute: 3.1 10*3/uL (ref 1.4–7.0)
Neutrophils: 48 %
RBC: 4.65 x10E6/uL (ref 3.77–5.28)
RDW: 12.6 % (ref 11.7–15.4)
WBC: 6.3 10*3/uL (ref 3.4–10.8)

## 2023-05-12 LAB — HEMOGLOBIN A1C
Est. average glucose Bld gHb Est-mCnc: 108 mg/dL
Hgb A1c MFr Bld: 5.4 % (ref 4.8–5.6)

## 2023-05-12 LAB — COMPREHENSIVE METABOLIC PANEL
ALT: 13 IU/L (ref 0–32)
AST: 16 IU/L (ref 0–40)
Albumin/Globulin Ratio: 1.4
Albumin: 4.4 g/dL (ref 3.9–4.9)
Alkaline Phosphatase: 65 IU/L (ref 44–121)
BUN/Creatinine Ratio: 8 — ABNORMAL LOW (ref 9–23)
BUN: 6 mg/dL (ref 6–20)
Bilirubin Total: 0.6 mg/dL (ref 0.0–1.2)
CO2: 24 mmol/L (ref 20–29)
Calcium: 9.2 mg/dL (ref 8.7–10.2)
Chloride: 102 mmol/L (ref 96–106)
Creatinine, Ser: 0.72 mg/dL (ref 0.57–1.00)
Globulin, Total: 3.1 g/dL (ref 1.5–4.5)
Glucose: 70 mg/dL (ref 70–99)
Potassium: 4.5 mmol/L (ref 3.5–5.2)
Sodium: 139 mmol/L (ref 134–144)
Total Protein: 7.5 g/dL (ref 6.0–8.5)
eGFR: 114 mL/min/{1.73_m2} (ref >59)

## 2023-05-12 LAB — FOLATE: Folate: 5 ng/mL (ref >3.0)

## 2023-05-12 LAB — LIPID PANEL WITH LDL/HDL RATIO
Cholesterol, Total: 169 mg/dL (ref 100–199)
HDL: 43 mg/dL (ref >39)
LDL Chol Calc (NIH): 111 mg/dL — ABNORMAL HIGH (ref 0–99)
LDL/HDL Ratio: 2.6 ratio (ref 0.0–3.2)
Triglycerides: 78 mg/dL (ref 0–149)
VLDL Cholesterol Cal: 15 mg/dL (ref 5–40)

## 2023-05-12 LAB — VITAMIN D 25 HYDROXY (VIT D DEFICIENCY, FRACTURES): Vit D, 25-Hydroxy: 17.8 ng/mL — ABNORMAL LOW (ref 30.0–100.0)

## 2023-05-12 LAB — VITAMIN B12: Vitamin B-12: 556 pg/mL (ref 232–1245)

## 2023-05-12 LAB — T4, FREE: Free T4: 1.08 ng/dL (ref 0.82–1.77)

## 2023-05-12 LAB — INSULIN, RANDOM: INSULIN: 5.2 u[IU]/mL (ref 2.6–24.9)

## 2023-05-12 LAB — TSH: TSH: 1.7 u[IU]/mL (ref 0.450–4.500)

## 2023-05-19 ENCOUNTER — Institutional Professional Consult (permissible substitution): Payer: 59 | Admitting: Plastic Surgery

## 2023-05-24 ENCOUNTER — Ambulatory Visit (INDEPENDENT_AMBULATORY_CARE_PROVIDER_SITE_OTHER): Payer: 59 | Admitting: Family Medicine

## 2023-06-07 ENCOUNTER — Telehealth (INDEPENDENT_AMBULATORY_CARE_PROVIDER_SITE_OTHER): Payer: 59 | Admitting: Psychology

## 2023-06-07 DIAGNOSIS — F419 Anxiety disorder, unspecified: Secondary | ICD-10-CM

## 2023-06-07 DIAGNOSIS — F5089 Other specified eating disorder: Secondary | ICD-10-CM | POA: Diagnosis not present

## 2023-06-07 NOTE — Progress Notes (Signed)
Office: [phone]  /  Fax: [phone]    Date: June 07, 2023    Appointment Start Time: 3:01pm Duration: 40 minutes Provider: Lawerance Cruel, Psy.D. Type of Session: Intake for Individual Therapy  Location of Patient: Home (private location) Location of Provider: Provider's home (private office) Type of Contact: Telepsychological Visit via MyChart Video Visit  Informed Consent: Prior to proceeding with today's appointment, two pieces of identifying information were obtained. In addition, Leslie Adams's physical location at the time of this appointment was obtained as well a phone number she could be reached at in the event of technical difficulties. Leslie Adams and this provider participated in today's telepsychological service.   The provider's role was explained to [employer]. The provider reviewed and discussed issues of confidentiality, privacy, and limits therein (e.g., reporting obligations). In addition to verbal informed consent, written informed consent for psychological services was obtained prior to the initial appointment. Since the clinic is not a 24/7 crisis center, mental health emergency resources were shared and this  provider explained MyChart, e-mail, voicemail, and/or other messaging systems should be utilized only for non-emergency reasons. This provider also explained that information obtained during appointments will be placed in Marasia's medical record and relevant information will be shared with other providers at [facility] at any locations for coordination of care. Aleen agreed information may be shared with other [facility] providers as needed for coordination of care and by signing the service agreement document, she provided written consent for coordination of care. Prior to initiating telepsychological services, Chelesea completed an informed consent document, which included the development of a safety plan (i.e., an emergency contact  and emergency resources) in the event of an emergency/crisis. Kaisey verbally acknowledged understanding she is ultimately responsible for understanding her insurance benefits for telepsychological and in-person services. This provider also reviewed confidentiality, as it relates to telepsychological services. Angela  acknowledged understanding that appointments cannot be recorded without both party consent and she is aware she is responsible for securing confidentiality on her end of the session. Veronia verbally consented to proceed.  Chief Complaint/HPI: Leslie Adams was referred by Dr. Thomasene Lot due to  other disorder of eating-emotional eating . Per the note for the visit with Dr. Thomasene Lot on 05/10/2023, "Condition is Not optimized. Denies any SI/HI. Mood is stable.Pt endorses eating when stressed and bored."   During today's appointment, Leslie Adams shared, "I personally don't feel like it is [referring to engagement in emotional eating behaviors]." She was verbally administered a questionnaire assessing various behaviors related to emotional eating behaviors. Legend endorsed the following: experience food cravings on a regular basis, find food is comforting to you, and overeat when you are alone, but eat much less when you are with other people. She shared she craves pizza and [name]. Ever believes the onset of emotional eating behaviors was likely in childhood. She stated she is unsure regarding the current frequency of engagement in emotional eating behaviors. In addition, Leslie Adams denied a history of binge eating behaviors. Leslie Adams denied a history of significantly restricting food intake. She reported she consumes a "slimming tea" that is "all natural" and described it as a laxative. Leslie Adams stated she used to take it daily and lost 40 pounds. She indicated she last consumed the tea in May. She shared she has never been diagnosed with an eating disorder. She also denied a history of  treatment for emotional eating behaviors. Leslie Adams indicated she previously ate one meal a day ("high calorie, high  carb"), but now is trying to consume breakfast. She indicated she is currently prescribed structured meal plan (Cat 2), adding she is following the plan "50/50." Furthermore, Leslie Adams denied other problems of concern.    Mental Status Examination:  Appearance: neat Behavior: appropriate to circumstances Mood: neutral Affect: mood congruent Speech: WNL Eye Contact: appropriate Psychomotor Activity: WNL Gait: unable to assess  Thought Process: linear, logical, and goal directed and denies suicidal, homicidal, and self-harm ideation, plan and intent  Thought Content/Perception: no hallucinations, delusions, bizarre thinking or behavior endorsed or observed Orientation: AAOx4 Memory/Concentration: intact Insight/Judgment: fair  Family & Psychosocial History: Leslie Adams reported she is not in a relationship and she does not have any children. 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 Additionally, Leslie Adams shared her highest level of education obtained is "some college." She stated she is enrolled PT in real estate school. Currently, Leslie Adams's social support system consists of her parents, siblings, and friends. Moreover, Leslie Adams stated she resides alone.   Medical History:  Past Medical History:  Diagnosis Date   Abscess of axilla, right    B12 deficiency    Food allergy    Hidradenitis suppurativa    SOB (shortness of breath)    UTI (lower urinary tract infection)    Vitamin D deficiency    No past surgical history on file. No current outpatient medications on file prior to visit.   No current facility-administered medications on file prior to visit.   Mental Health History: Leslie Adams reported she attended therapeutic services, noting the last appointment was in December 2023 to address ongoing  stressors. She denied a history of psychotropic medications. Leslie Adams reported there is no history of hospitalizations for psychiatric concerns. Leslie Adams denied a family history of mental health/substance abuse related concerns. Furthermore, Imanie reported there is no history of trauma including psychological, physical , and sexual abuse, as well as neglect.   Marcey described her typical mood lately as "tired, drained."  She endorsed experiencing the following: fidgeting; challenges with focusing; challenges with time management; and worry (e.g., life, bills, school work, future). Keyshla denied current alcohol use.  She denied current tobacco use. She denied illicit/recreational substance use. Furthermore, Leslie Adams indicated she is not experiencing the following: hallucinations and delusions, paranoia, symptoms of mania , social withdrawal, crying spells, panic attacks, memory concerns, and obsessions and compulsions. She also denied history of and current suicidal ideation, plan, and intent; history of and current homicidal ideation, plan, and intent; and history of and current engagement in self-harm.  Legal History: Nanda reported there is no history of legal involvement.   Structured Assessments Results: The Patient Health Questionnaire-9 (PHQ-9) is a self-report measure that assesses symptoms and severity of depression over the course of the last two weeks. Rebacca obtained a score of 10 suggesting moderate depression. Kysa finds the endorsed symptoms to be somewhat difficult. [0= Not at all; 1= Several days; 2= More than half the days; 3= Nearly every day] Little interest or pleasure in doing things 0  Feeling down, depressed, or hopeless 0  Trouble falling or staying asleep, or sleeping too much 3  Feeling tired or having little energy 3  Poor appetite or overeating- fluctuations 2  Feeling bad about yourself --- or that you are a failure or have let yourself or your family down 0   Trouble concentrating on things, such as reading the newspaper or watching television 2  Moving or speaking  so slowly that other people could have noticed? Or the opposite --- being so fidgety or restless that you have been moving around a lot more than usual 0  Thoughts that you would be better off dead or hurting yourself in some way 0  PHQ-9 Score 10    The Generalized Anxiety Disorder-7 (GAD-7) is a brief self-report measure that assesses symptoms of anxiety over the course of the last two weeks. Finnley obtained a score of 10 suggesting moderate anxiety. Nyonna finds the endorsed symptoms to be somewhat difficult. [0= Not at all; 1= Several days; 2= Over half the days; 3= Nearly every day] Feeling nervous, anxious, on edge 0  Not being able to stop or control worrying 3  Worrying too much about different things 3  Trouble relaxing 1  Being so restless that it's hard to sit still 0  Becoming easily annoyed or irritable 3  Feeling afraid as if something awful might happen 0  GAD-7 Score 10   Interventions:  Conducted a chart review Focused on rapport building Verbally administered PHQ-9 and GAD-7 for symptom monitoring Verbally administered Food & Mood questionnaire to assess various behaviors related to emotional eating Provided emphatic reflections and validation Psychoeducation provided regarding physical versus emotional hunger Recommended/discussed option for longer-term therapeutic services Psychoeducation regarding the consequences of laxative use Discussed benefits of eating regularly/smaller meals  Diagnostic Impressions & Provisional DSM-5 Diagnosis(es): Deetta initially stated she is unsure if she engages in emotional eating behaviors, but endorsed items on the Food & Mood questionnaire. She believes the onset of emotional eating behaviors was likely in childhood. She stated she is unsure regarding the current frequency of engagement in emotional eating behaviors. Kimyata  discussed consuming a "slimming tea" in the past for weight loss, noting the last time was in May 2024. She also disclosed she does not eat enough (one big a meal a day and now some breakfast). She also endorsed experiencing anxiety-related symptomatology. Based on the aforementioned, the following diagnoses were assigned: F50.89 Other Specified Feeding or Eating Disorder, Emotional Eating Behaviors and Insufficient Intake and F41.9 Unspecified Anxiety Disorder. Given the limited scope of this appointment and this provider's role with the clinic, Maymuna would benefit from further evaluation of the other endorsed concerns (e.g., challenges with focusing).   Plan: Korynn appears able and willing to participate as evidenced by engagement in reciprocal conversation and asking questions as needed for clarification. The next appointment is scheduled for 07/11/2023 at 4pm, which will be via MyChart Video Visit. The following treatment goal was established: increase coping skills. This provider will regularly review the treatment plan and medical chart to keep informed of status changes. Siobhan expressed understanding and agreement with the initial treatment plan of care. Verona will be sent a handout via e-mail to utilize between now and the next appointment to increase awareness of hunger patterns and subsequent eating. Leslie Adams provided verbal consent during today's appointment for this provider to send the handout via e-mail. Additionally, Lorian will re-initiate therapeutic services with her previous therapist. She also was receptive to discussing her use of the slimming tea with Dr. Sharee Holster during their next appointment.

## 2023-06-13 ENCOUNTER — Ambulatory Visit (INDEPENDENT_AMBULATORY_CARE_PROVIDER_SITE_OTHER): Payer: 59 | Admitting: Family Medicine

## 2023-06-15 ENCOUNTER — Encounter (INDEPENDENT_AMBULATORY_CARE_PROVIDER_SITE_OTHER): Payer: Self-pay | Admitting: Family Medicine

## 2023-06-15 ENCOUNTER — Ambulatory Visit (INDEPENDENT_AMBULATORY_CARE_PROVIDER_SITE_OTHER): Payer: 59 | Admitting: Family Medicine

## 2023-06-15 VITALS — BP 128/85 | HR 94 | Temp 99.6°F | Ht 66.0 in | Wt 244.0 lb

## 2023-06-15 DIAGNOSIS — E7841 Elevated Lipoprotein(a): Secondary | ICD-10-CM

## 2023-06-15 DIAGNOSIS — F5089 Other specified eating disorder: Secondary | ICD-10-CM

## 2023-06-15 DIAGNOSIS — E559 Vitamin D deficiency, unspecified: Secondary | ICD-10-CM | POA: Diagnosis not present

## 2023-06-15 DIAGNOSIS — R030 Elevated blood-pressure reading, without diagnosis of hypertension: Secondary | ICD-10-CM

## 2023-06-15 DIAGNOSIS — L732 Hidradenitis suppurativa: Secondary | ICD-10-CM

## 2023-06-15 DIAGNOSIS — Z6839 Body mass index (BMI) 39.0-39.9, adult: Secondary | ICD-10-CM

## 2023-06-15 DIAGNOSIS — E669 Obesity, unspecified: Secondary | ICD-10-CM

## 2023-06-15 MED ORDER — VITAMIN D (ERGOCALCIFEROL) 1.25 MG (50000 UNIT) PO CAPS: 50000.0000 [IU] | ORAL_CAPSULE | ORAL | 0 refills | Status: DC

## 2023-06-15 NOTE — Progress Notes (Signed)
Leslie Adams, D.O.  ABFM, ABOM Clinical Bariatric Medicine Physician  Office located at: [address] [place], [place]  [address]     Assessment and Plan:   No orders of the defined types were placed in this encounter.   There are no discontinued medications.   Meds ordered this encounter  Medications   Vitamin D, Ergocalciferol, (DRISDOL) 1.25 MG (50000 UNIT) CAPS capsule    Sig: Take 1 capsule (50,000 Units total) by mouth every 7 (seven) days.    Dispense:  4 capsule    Refill:  0     Vitamin D deficiency Assessment: Condition is Not at goal. Her vitamin D level is very low at 17.8. She does not take any vitamin D supplement at this time.  Lab Results  Component Value Date   VD25OH 17.8 (L) 05/10/2023   Lab Results  Component Value Date   CREATININE 0.72 05/10/2023   BUN 6 05/10/2023   NA 139 05/10/2023   K 4.5 05/10/2023   CL 102 05/10/2023   CO2 24 05/10/2023      Component Value Date/Time   PROT 7.5 05/10/2023 1019   ALBUMIN 4.4 05/10/2023 1019   AST 16 05/10/2023 1019   AST 13 (L) 10/08/2021 1347   ALT 13 05/10/2023 1019   ALT 12 10/08/2021 1347   ALKPHOS 65 05/10/2023 1019   BILITOT 0.6 05/10/2023 1019   BILITOT 0.7 10/08/2021 1347      Component Value Date/Time   WBC 6.3 05/10/2023 1019   WBC 7.2 10/08/2021 1347   RBC 4.65 05/10/2023 1019   RBC 4.47 10/08/2021 1347   HGB 12.9 05/10/2023 1019   HCT 40.5 05/10/2023 1019   PLT CANCELED 05/10/2023 1019   MCV 87 05/10/2023 1019   MCH 27.7 05/10/2023 1019   MCH 27.5 10/08/2021 1347   MCHC 31.9 05/10/2023 1019   MCHC 32.8 10/08/2021 1347   RDW 12.6 05/10/2023 1019   LYMPHSABS 2.5 05/10/2023 1019   MONOABS 0.7 10/08/2021 1347   EOSABS 0.1 05/10/2023 1019   BASOSABS 0.0 05/10/2023 1019   Lab Results  Component Value Date   TSH 1.700 05/10/2023   Lab Results  Component Value Date   FOLATE 5.0 05/10/2023   Lab Results  Component Value Date   VITAMINB12 556 05/10/2023   Lab  Results  Component Value Date   HGBA1C 5.4 05/10/2023   INSULIN 5.2 05/10/2023   Plan: Begin Ergocalciferol 50K IU weekly.  - I discussed the importance of vitamin D to the patient's health and well-being as well as to their ability to lose weight.  - I reviewed possible symptoms of low Vitamin D:  low energy, depressed mood, muscle aches, joint aches, osteoporosis etc. with patient - It has been show that administration of vitamin D supplementation leads to improved satiety and a decrease in inflammatory markers.  Hence, low Vitamin D levels may be linked to an increased risk of cardiovascular events and even increased risk of cancers- such as colon and breast. - Stressed importance of dietary and lifestyle modifications to result in weight loss as first line txmnt - I counseled patient on pathophysiology of the disease process of I.R  and Pre-DM.  (Please delete pre-dm if pt just has IR)  - Continue to decrease simple carbs/ sugars; increase fiber and proteins -> follow her meal plan.   - weight loss will likely improve availability of vitamin D, thus encouraged Leslie Adams to continue with meal plan and their weight  loss efforts to further improve this condition.  Thus, we will need to monitor levels regularly (every 3-4 mo on average) to keep levels within normal limits and prevent over supplementation. - We will recheck A1c and fasting insulin level in approximately 3 months from last check, or as deemed appropriate.  - pt's questions and concerns regarding this condition addressed.  Labs were reviewed with patient today and education provided on them. We discussed how the foods patient eats may influence these laboratory findings.  All of the patient's questions about them were answered    Elevated lipoprotein(a) Assessment: Condition is Not at goal.. She has an elevated LDL level. She is currently not on any medication for this. This is diet/exercise controlled.  Lab Results  Component Value  Date   CHOL 169 05/10/2023   HDL 43 05/10/2023   LDLCALC 111 (H) 05/10/2023   TRIG 78 05/10/2023   Plan: - I stressed the importance that patient continue with our prudent nutritional plan that is low in saturated and trans fats, and low in fatty carbs to improve these numbers.  - We recommend: aerobic activity with eventual goal of a minimum of 150+ min wk plus 2 days/ week of resistance or strength training.   - We will continue routine screening as patient continues to achieve health goals along their weight loss journey Labs were reviewed with patient today and education provided on them. We discussed how the foods patient eats may influence these laboratory findings.  All of the patient's questions about them were answered    Other Specified Feeding or Eating Disorder, Emotional Eating Behaviors and Insufficient Intake Assessment: Condition is Controlled. She did meet with Dr. Dewaine Conger and states that her last appointment went well.  Denies any SI/HI. Mood is stable. Cravings and hunger are well controlled. She denies any cravings and has only ate off plan to "finish the food in her fridge". Although patient endorses eating less carbs and more proteins.   Plan: -Continue to see Dr. Dewaine Conger as planned.  -Discussed the importance of not skipping meals and getting all her proteins and fiber in on a daily basis discussed.   - Reminded patient of the importance of following their prudent nutrition plan and how food can affect mood as well to support emotional wellbeing.  - We will continue to monitor closely alongside PCP / other specialists.   Elevated blood pressure reading without diagnosis of hypertension Assessment: Condition is improved but not optimized. Her BP has improved from 148/85 to 128/85. This is diet/exercise controlled.  Last 3 blood pressure readings in our office are as follows: BP Readings from Last 3 Encounters:  06/15/23 128/85  05/10/23 (!) 148/85  04/21/23 136/89    Plan: - Her BP is 128/85 today.  - Ambulatory blood pressure monitoring encouraged.  Reminded patient that if they ever feel poorly in any way, to check their blood pressure and pulse as well. - We will continue to monitor closely alongside PCP/ specialists.  Pt reminded to also f/up with those individuals as instructed by them. We will continue to monitor symptoms as they relate to the her weight loss journey.   Hidradenitis Suppurative-recurrent Assessment: Condition is Not optimized. She continues to see a dermatologist and is not taking any medicines for this condition.   Plan: Continue to see her dermatologist as needed.    TREATMENT PLAN FOR OBESITY: Obesity, Class III, BMI 40-49.9 (morbid obesity) (HCC) Assessment:  Leslie Adams is here to discuss her progress  with her obesity treatment plan along with follow-up of her obesity related diagnoses. See Medical Weight Management Flowsheet for complete bioelectrical impedance results.  Condition is docourse: improving. Biometric data collected today, was reviewed with patient.   Since last office visit patient's on 60/09/2023  Muscle mass has increased by 5.8lb. Fat mass has decreased by 16.2lb. Total body water has decreased by 1.4lb.  Counseling done on how various foods will affect these numbers and how to maximize success  Total lbs lost to date: 10lbs Total weight loss percentage to date: 3.94%  Plan: Continue to adhere to the category 2 meal plan with both B and L options.   - Unless pre-existing renal or cardiopulmonary conditions exist which patient was told to limit their fluid intake by another provider, I recommended roughly one half of their weight in pounds, to be the approximate ounces of non-caloric, non-caffeinated beverages they should drink per day; including more if they are engaging in exercise.  Behavioral Intervention Additional resources provided today: breakfast options Evidence-based interventions  for health behavior change were utilized today including the discussion of self monitoring techniques, problem-solving barriers and SMART goal setting techniques.   Regarding patient's less desirable eating habits and patterns, we employed the technique of small changes.  Pt will specifically work on: following the meal plan 100% for next visit.    Recommended Physical Activity Goals Landrie has been advised to slowly work up to 150 minutes of moderate intensity aerobic activity a week and strengthening exercises 2-3 times per week for cardiovascular health, weight loss maintenance and preservation of muscle mass.   She has agreed to Continue current level of physical activity   FOLLOW UP: Return in about 4 weeks (around 07/13/2023). She was informed of the importance of frequent follow up visits to maximize her success with intensive lifestyle modifications for her multiple health conditions. Pt is going on vacation and is unable to follow-up in 2 weeks.   Subjective:   Chief complaint: Obesity Manami is here to discuss her progress with her obesity treatment plan. She is on the the Category 2 Plan with only lunch options and states she is following her eating plan approximately 0 % of the time. She states she is walking 30 minutes 5 days per week.  Interval History:  ATHELENE HURSEY is here today for her first follow-up office visit since starting the program with [place].  Since last OV she has mainly focused on healthy eating in just the last 2 weeks. She states that she has been trying to eat healthy breakfast options but not completely and has started eating greek yogurt. She states that the lunch meal plan was too much food for her and she tried to eat healthy food in her own way. She does eat pork bacon for lunch and not "lean" meats. Her main challenges were trying to finish the current food in her fridge causing her to eat off the meal plan.  She tries to drink about a gallon of water per  day.   Review of Systems:  Pertinent positives were addressed with patient today.  Weight Summary and Biometrics   Weight Lost Since Last Visit: 10lb  Weight Gained Since Last Visit: 0lb  Vitals Temp: 99.6 F (37.6 C) BP: 128/85 Pulse Rate: 94 SpO2: 98 %   Anthropometric Measurements Height: 5\' 6"  (1.676 m) Weight: 244 lb (110.7 kg) BMI (Calculated): 39.4 Weight at Last Visit: 254lb Weight Lost Since Last Visit: 10lb Weight Gained Since Last Visit:  0lb Starting Weight: 254lb Total Weight Loss (lbs): 10 lb (4.536 kg) Peak Weight: 292lb   Body Composition  Body Fat %: 43 % Fat Mass (lbs): 105 lbs Muscle Mass (lbs): 132 lbs Total Body Water (lbs): 91.2 lbs Visceral Fat Rating : 10   Other Clinical Data Fasting: no Labs: no Today's Visit #: 2 Starting Date: 05/10/23   Objective:   PHYSICAL EXAM:  Blood pressure 128/85, pulse 94, temperature 99.6 F (37.6 C), height 5\' 6"  (1.676 m), weight 244 lb (110.7 kg), SpO2 98%. Body mass index is 39.38 kg/m.  General: Well Developed, well nourished, and in no acute distress.  HEENT: Normocephalic, atraumatic Skin: Warm and dry, cap RF less 2 sec, good turgor Chest:  Normal excursion, shape, no gross abn Respiratory: speaking in full sentences, no conversational dyspnea NeuroM-Sk: Ambulates w/o assistance, moves * 4 Psych: A and O *3, insight good, mood-full  DIAGNOSTIC DATA REVIEWED:  BMET    Component Value Date/Time   NA 139 05/10/2023 1019   K 4.5 05/10/2023 1019   CL 102 05/10/2023 1019   CO2 24 05/10/2023 1019   GLUCOSE 70 05/10/2023 1019   GLUCOSE 83 10/08/2021 1347   BUN 6 05/10/2023 1019   CREATININE 0.72 05/10/2023 1019   CREATININE 0.70 10/08/2021 1347   CALCIUM 9.2 05/10/2023 1019   GFRNONAA >60 10/08/2021 1347   Lab Results  Component Value Date   HGBA1C 5.4 05/10/2023   Lab Results  Component Value Date   INSULIN 5.2 05/10/2023   Lab Results  Component Value Date   TSH 1.700  05/10/2023   CBC    Component Value Date/Time   WBC 6.3 05/10/2023 1019   WBC 7.2 10/08/2021 1347   RBC 4.65 05/10/2023 1019   RBC 4.47 10/08/2021 1347   HGB 12.9 05/10/2023 1019   HCT 40.5 05/10/2023 1019   PLT CANCELED 05/10/2023 1019   MCV 87 05/10/2023 1019   MCH 27.7 05/10/2023 1019   MCH 27.5 10/08/2021 1347   MCHC 31.9 05/10/2023 1019   MCHC 32.8 10/08/2021 1347   RDW 12.6 05/10/2023 1019   Iron Studies No results found for: "IRON", "TIBC", "FERRITIN", "IRONPCTSAT" Lipid Panel     Component Value Date/Time   CHOL 169 05/10/2023 1019   TRIG 78 05/10/2023 1019   HDL 43 05/10/2023 1019   LDLCALC 111 (H) 05/10/2023 1019   Hepatic Function Panel     Component Value Date/Time   PROT 7.5 05/10/2023 1019   ALBUMIN 4.4 05/10/2023 1019   AST 16 05/10/2023 1019   AST 13 (L) 10/08/2021 1347   ALT 13 05/10/2023 1019   ALT 12 10/08/2021 1347   ALKPHOS 65 05/10/2023 1019   BILITOT 0.6 05/10/2023 1019   BILITOT 0.7 10/08/2021 1347      Component Value Date/Time   TSH 1.700 05/10/2023 1019   Nutritional Lab Results  Component Value Date   VD25OH 17.8 (L) 05/10/2023    Attestations:   Reviewed by clinician on day of visit: allergies, medications, problem list, medical history, surgical history, family history, social history, and previous encounter notes.   Patient was in the office today and time spent on visit including pre-visit chart review and post-visit care/coordination of care and electronic medical record documentation was 52 minutes. 50% of the time was in face to face counseling of this patient's medical condition(s) and providing education on treatment options to include the first-line treatment of diet and lifestyle modification.  I, Clinical biochemist, acting as a Administrator  scribe for Thomasene Lot, DO., have compiled all relevant documentation for today's office visit on behalf of Thomasene Lot, DO, while in the presence of [employer], DO.  I have  reviewed the above documentation for accuracy and completeness, and I agree with the above. Leslie Adams, D.O.  The 21st Century Cures Act was signed into law in 2016 which includes the topic of electronic health records.  This provides immediate access to information in MyChart.  This includes consultation notes, operative notes, office notes, lab results and pathology reports.  If you have any questions about what you read please let us know at your next visit so we can discuss your concerns and take corrective action if need be.  We are right here with you.

## 2023-06-29 ENCOUNTER — Institutional Professional Consult (permissible substitution): Payer: 59 | Admitting: Plastic Surgery

## 2023-07-04 ENCOUNTER — Institutional Professional Consult (permissible substitution): Payer: 59 | Admitting: Plastic Surgery

## 2023-07-07 ENCOUNTER — Ambulatory Visit (INDEPENDENT_AMBULATORY_CARE_PROVIDER_SITE_OTHER): Payer: 59 | Admitting: Family Medicine

## 2023-07-08 ENCOUNTER — Ambulatory Visit
Admission: EM | Admit: 2023-07-08 | Discharge: 2023-07-08 | Disposition: A | Discharge status: Home / Self Care | Payer: 59 | Attending: Physician Assistant | Admitting: Physician Assistant

## 2023-07-08 DIAGNOSIS — L0291 Cutaneous abscess, unspecified: Secondary | ICD-10-CM

## 2023-07-08 MED ORDER — DOXYCYCLINE HYCLATE 100 MG PO CAPS: 100.0000 mg | ORAL_CAPSULE | Freq: Two times a day (BID) | ORAL | 0 refills | Status: DC

## 2023-07-08 NOTE — ED Provider Notes (Signed)
[facility]    CSN: [id number] Arrival date & time: 07/08/23  1415      History   Chief Complaint Chief Complaint  Patient presents with   Abscess    HPI Leslie Adams is a 32 y.o. female.   Patient here today for evaluation of abscess to her left axilla that she first noticed this past week. She has not had fever. Area is painful and she has had similar in the past. She denies any nausea or vomiting.   The history is provided by the patient.  Abscess Associated symptoms: no fever, no nausea and no vomiting     Past Medical History:  Diagnosis Date   Abscess of axilla, right    B12 deficiency    Food allergy    Hidradenitis suppurativa    SOB (shortness of breath)    UTI (lower urinary tract infection)    Vitamin D deficiency     Patient Active Problem List   Diagnosis Date Noted   Obesity, Class III, BMI 40-49.9 (morbid obesity) (HCC) 04/11/2023   Suppurative hidradenitis-recurrent 04/11/2023   Routine general medical examination at a health care facility 04/11/2023   Thrombocytopenia (HCC) 10/08/2021    History reviewed. No pertinent surgical history.  OB History   No obstetric history on file.      Home Medications    Prior to Admission medications   Medication Sig Start Date End Date Taking? Authorizing Provider  doxycycline (VIBRAMYCIN) 100 MG capsule Take 1 capsule (100 mg total) by mouth 2 (two) times daily. 07/08/23  Yes Tomi Bamberger, PA-C  Vitamin D, Ergocalciferol, (DRISDOL) 1.25 MG (50000 UNIT) CAPS capsule Take 1 capsule (50,000 Units total) by mouth every 7 (seven) days. 06/15/23   Thomasene Lot, DO    Family History Family History  Problem Relation Age of Onset   Diabetes Father    Hypertension Father     Social History Social History   Tobacco Use   Smoking status: Former    Current packs/day: 0.00    Types: Cigarettes    Quit date: 02/06/2012    Years since quitting: 11.4   Smokeless tobacco: Never   Substance Use Topics   Alcohol use: Yes   Drug use: No     Allergies   Fish allergy and Other   Review of Systems Review of Systems  Constitutional:  Negative for chills and fever.  Eyes:  Negative for discharge and redness.  Gastrointestinal:  Negative for abdominal pain, nausea and vomiting.  Genitourinary:  Positive for vaginal bleeding and vaginal discharge.     Physical Exam Triage Vital Signs ED Triage Vitals  Encounter Vitals Group     BP 07/08/23 1517 (!) 150/97     Systolic BP Percentile --      Diastolic BP Percentile --      Pulse Rate 07/08/23 1516 74     Resp 07/08/23 1516 16     Temp 07/08/23 1516 99.1 F (37.3 C)     Temp Source 07/08/23 1516 Oral     SpO2 07/08/23 1516 97 %     Weight --      Height --      Head Circumference --      Peak Flow --      Pain Score 07/08/23 1517 10     Pain Loc --      Pain Education --      Exclude from Growth Chart --    No data  found.  Updated Vital Signs BP (!) 150/97 (BP Location: Left Arm)   Pulse 74   Temp 99.1 F (37.3 C) (Oral)   Resp 16   LMP 06/28/2023 (Approximate)   SpO2 97%    Physical Exam Vitals and nursing note reviewed.  Constitutional:      General: She is not in acute distress.    Appearance: Normal appearance. She is not ill-appearing.  HENT:     Head: Normocephalic and atraumatic.  Eyes:     Conjunctiva/sclera: Conjunctivae normal.  Cardiovascular:     Rate and Rhythm: Normal rate.  Pulmonary:     Effort: Pulmonary effort is normal. No respiratory distress.  Skin:    Comments: Approx 3 cm irregularly shaped area of induration and erythema to left axilla  Neurological:     Mental Status: She is alert.  Psychiatric:        Mood and Affect: Mood normal.        Behavior: Behavior normal.        Thought Content: Thought content normal.      UC Treatments / Results  Labs (all labs ordered are listed, but only abnormal results are displayed) Labs Reviewed - No data to  display  EKG   Radiology No results found.  Procedures Procedures (including critical care time)  Medications Ordered in UC Medications - No data to display  Initial Impression / Assessment and Plan / UC Course  I have reviewed the triage vital signs and the nursing notes.  Pertinent labs & imaging results that were available during my care of the patient were reviewed by me and considered in my medical decision making (see chart for details).    Incision and drainage deferred today- will treat with oral antibiotics. Encouraged warm compresses to promote drainage and follow up if no gradual improvement or with any further concerns.   Final Clinical Impressions(s) / UC Diagnoses   Final diagnoses:  Abscess   Discharge Instructions   None    ED Prescriptions     Medication Sig Dispense Auth. Provider   doxycycline (VIBRAMYCIN) 100 MG capsule Take 1 capsule (100 mg total) by mouth 2 (two) times daily. 20 capsule Tomi Bamberger, PA-C      PDMP not reviewed this encounter.   Tomi Bamberger, PA-C 07/08/23 1606

## 2023-07-08 NOTE — ED Triage Notes (Signed)
Pt states she has a boil under her left armpit for the past week.

## 2023-07-11 ENCOUNTER — Telehealth (INDEPENDENT_AMBULATORY_CARE_PROVIDER_SITE_OTHER): Payer: 59 | Admitting: Psychology

## 2023-07-11 ENCOUNTER — Telehealth (INDEPENDENT_AMBULATORY_CARE_PROVIDER_SITE_OTHER): Payer: Self-pay | Admitting: Psychology

## 2023-07-11 NOTE — Progress Notes (Unsigned)
  Office: [phone]  /  Fax: [phone]    Date: July 11, 2023  Appointment Start Time: *** Duration: *** minutes Provider: Lawerance Cruel, Psy.D. Type of Session: Individual Therapy  Location of Patient: {gbptloc:23249} (private location) Location of Provider: Provider's Home (private office) Type of Contact: Telepsychological Visit via MyChart Video Visit  Session Content: This provider called Leslie Adams at 4:03pm as she did not present for today's appointment. A HIPAA compliant voicemail was left requesting a call back.  As such, today's appointment was initiated *** minutes late.Leslie Adams is a 32 y.o. female presenting for a follow-up appointment to address the previously established treatment goal of increasing coping skills.Today's appointment was a telepsychological visit. [name] provided verbal consent for today's telepsychological appointment and she is aware she is responsible for securing confidentiality on her end of the session. Prior to proceeding with today's appointment, [name]'s physical location at the time of this appointment was obtained as well a phone number she could be reached at in the event of technical difficulties. [name] and this provider participated in today's telepsychological service.   This provider conducted a brief check-in. *** [name] was receptive to today's appointment as evidenced by openness to sharing, responsiveness to feedback, and {gbreceptiveness:23401}.  Mental Status Examination:  Appearance: {Appearance:22431} Behavior: {Behavior:22445} Mood: {gbmood:21757} Affect: {Affect:22436} Speech: {Speech:22432} Eye Contact: {Eye Contact:22433} Psychomotor Activity: {Motor Activity:22434} Gait: {gbgait:23404} Thought Process: {thought process:22448}  Thought Content/Perception: {disturbances:22451} Orientation: {Orientation:22437} Memory/Concentration: {gbcognition:22449} Insight: {Insight:22446} Judgment: {Insight:22446}  Interventions:   {Interventions for Progress Notes:23405}  DSM-5 Diagnosis(es):  F50.89 Other Specified Feeding or Eating Disorder, Emotional Eating Behaviors and Insufficient Intake and 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 [name] has demonstrated progress in her goal as evidenced by {gbtxprogress:22839}. [name] also {gbtxprogress2:22951}.  Plan: The next appointment is scheduled for *** at ***, which will be via MyChart Video Visit. The next session will focus on {Plan for Next Appointment:23400}.

## 2023-07-11 NOTE — Telephone Encounter (Signed)
  Office: [phone]  /  Fax: [phone]  Date of Call: July 11, 2023  Time of Call: 4:03pm Provider: Lawerance Cruel, PsyD  CONTENT: This provider called Leslie Adams to check-in as she did not present for today's MyChart Video Visit appointment. A HIPAA compliant voicemail was left requesting a call back. Of note, this provider stayed on the MyChart Video Visit appointment for 5 minutes prior to signing off per the clinic's grace period policy.    PLAN: This provider will wait for [name] to call back. No further follow-up planned by this provider.

## 2023-07-20 ENCOUNTER — Ambulatory Visit (INDEPENDENT_AMBULATORY_CARE_PROVIDER_SITE_OTHER): Payer: 59 | Admitting: Physician Assistant

## 2024-06-29 ENCOUNTER — Encounter: Payer: Self-pay | Admitting: Family Medicine

## 2024-06-29 ENCOUNTER — Ambulatory Visit (INDEPENDENT_AMBULATORY_CARE_PROVIDER_SITE_OTHER): Admitting: Family Medicine

## 2024-06-29 VITALS — BP 128/80 | HR 82 | Temp 99.1°F | Resp 12 | Ht 66.0 in | Wt 234.0 lb

## 2024-06-29 DIAGNOSIS — Z13220 Encounter for screening for lipoid disorders: Secondary | ICD-10-CM | POA: Diagnosis not present

## 2024-06-29 DIAGNOSIS — Z130 Encounter for screening for diseases of the blood and blood-forming organs and certain disorders involving the immune mechanism: Secondary | ICD-10-CM | POA: Diagnosis not present

## 2024-06-29 DIAGNOSIS — E559 Vitamin D deficiency, unspecified: Secondary | ICD-10-CM

## 2024-06-29 DIAGNOSIS — Z13228 Encounter for screening for other metabolic disorders: Secondary | ICD-10-CM | POA: Diagnosis not present

## 2024-06-29 DIAGNOSIS — Z1329 Encounter for screening for other suspected endocrine disorder: Secondary | ICD-10-CM

## 2024-06-29 DIAGNOSIS — Z0000 Encounter for general adult medical examination without abnormal findings: Secondary | ICD-10-CM | POA: Diagnosis not present

## 2024-06-29 LAB — LIPID PANEL
Cholesterol: 180 mg/dL (ref 0–200)
HDL: 43.3 mg/dL (ref >39.00)
LDL Cholesterol: 123 mg/dL — ABNORMAL HIGH (ref 0–99)
NonHDL: 136.6
Total CHOL/HDL Ratio: 4
Triglycerides: 66 mg/dL (ref 0.0–149.0)
VLDL: 13.2 mg/dL (ref 0.0–40.0)

## 2024-06-29 LAB — BASIC METABOLIC PANEL WITH GFR
BUN: 5 mg/dL — ABNORMAL LOW (ref 6–23)
CO2: 26 meq/L (ref 19–32)
Calcium: 9.6 mg/dL (ref 8.4–10.5)
Chloride: 102 meq/L (ref 96–112)
Creatinine, Ser: 0.65 mg/dL (ref 0.40–1.20)
GFR: 115.78 mL/min (ref >60.00)
Glucose, Bld: 90 mg/dL (ref 70–99)
Potassium: 4.2 meq/L (ref 3.5–5.1)
Sodium: 137 meq/L (ref 135–145)

## 2024-06-29 LAB — HEMOGLOBIN A1C: Hgb A1c MFr Bld: 5.3 % (ref 4.6–6.5)

## 2024-06-29 LAB — VITAMIN D 25 HYDROXY (VIT D DEFICIENCY, FRACTURES): VITD: 16.42 ng/mL — ABNORMAL LOW (ref 30.00–100.00)

## 2024-06-29 NOTE — Patient Instructions (Addendum)
 A few things to remember from today's visit:  Routine general medical examination at a health care facility  Screening for lipoid disorders - Plan: Lipid panel  Screening for endocrine, metabolic and immunity disorder - Plan: Basic metabolic panel with GFR, Hemoglobin A1c  Vitamin D  deficiency, unspecified - Plan: VITAMIN D  25 Hydroxy (Vit-D Deficiency, Fractures)  Do not use My Chart to request refills or for acute issues that need immediate attention. If you send a my chart message, it may take a few days to be addressed, specially if I am not in the office.  Please be sure medication list is accurate. If a new problem present, please set up appointment sooner than planned today.  Health Maintenance, Female Adopting a healthy lifestyle and getting preventive care are important in promoting health and wellness. Ask your health care provider about: The right schedule for you to have regular tests and exams. Things you can do on your own to prevent diseases and keep yourself healthy. What should I know about diet, weight, and exercise? Eat a healthy diet  Eat a diet that includes plenty of vegetables, fruits, low-fat dairy products, and lean protein. Do not eat a lot of foods that are high in solid fats, added sugars, or sodium. Maintain a healthy weight Body mass index (BMI) is used to identify weight problems. It estimates body fat based on height and weight. Your health care provider can help determine your BMI and help you achieve or maintain a healthy weight. Get regular exercise Get regular exercise. This is one of the most important things you can do for your health. Most adults should: Exercise for at least 150 minutes each week. The exercise should increase your heart rate and make you sweat (moderate-intensity exercise). Do strengthening exercises at least twice a week. This is in addition to the moderate-intensity exercise. Spend less time sitting. Even light physical  activity can be beneficial. Watch cholesterol and blood lipids Have your blood tested for lipids and cholesterol at 33 years of age, then have this test every 5 years. Have your cholesterol levels checked more often if: Your lipid or cholesterol levels are high. You are older than 33 years of age. You are at high risk for heart disease. What should I know about cancer screening? Depending on your health history and family history, you may need to have cancer screening at various ages. This may include screening for: Breast cancer. Cervical cancer. Colorectal cancer. Skin cancer. Lung cancer. What should I know about heart disease, diabetes, and high blood pressure? Blood pressure and heart disease High blood pressure causes heart disease and increases the risk of stroke. This is more likely to develop in people who have high blood pressure readings or are overweight. Have your blood pressure checked: Every 3-5 years if you are 15-12 years of age. Every year if you are 84 years old or older. Diabetes Have regular diabetes screenings. This checks your fasting blood sugar level. Have the screening done: Once every three years after age 74 if you are at a normal weight and have a low risk for diabetes. More often and at a younger age if you are overweight or have a high risk for diabetes. What should I know about preventing infection? Hepatitis B If you have a higher risk for hepatitis B, you should be screened for this virus. Talk with your health care provider to find out if you are at risk for hepatitis B infection. Hepatitis C Testing is recommended for:  Everyone born from [birth year] through 1965. Anyone with known risk factors for hepatitis C. Sexually transmitted infections (STIs) Get screened for STIs, including gonorrhea and chlamydia, if: You are sexually active and are younger than 33 years of age. You are older than 33 years of age and your health care provider tells you that you  are at risk for this type of infection. Your sexual activity has changed since you were last screened, and you are at increased risk for chlamydia or gonorrhea. Ask your health care provider if you are at risk. Ask your health care provider about whether you are at high risk for HIV. Your health care provider may recommend a prescription medicine to help prevent HIV infection. If you choose to take medicine to prevent HIV, you should first get tested for HIV. You should then be tested every 3 months for as long as you are taking the medicine. Pregnancy If you are about to stop having your period (premenopausal) and you may become pregnant, seek counseling before you get pregnant. Take 400 to 800 micrograms (mcg) of folic acid  every day if you become pregnant. Ask for birth control (contraception) if you want to prevent pregnancy. Osteoporosis and menopause Osteoporosis is a disease in which the bones lose minerals and strength with aging. This can result in bone fractures. If you are 33 years old or older, or if you are at risk for osteoporosis and fractures, ask your health care provider if you should: Be screened for bone loss. Take a calcium or vitamin D  supplement to lower your risk of fractures. Be given hormone replacement therapy (HRT) to treat symptoms of menopause. Follow these instructions at home: Alcohol use Do not drink alcohol if: Your health care provider tells you not to drink. You are pregnant, may be pregnant, or are planning to become pregnant. If you drink alcohol: Limit how much you have to: 0-1 drink a day. Know how much alcohol is in your drink. In the [place] one drink equals one 12 oz bottle of beer (355 mL), one 5 oz glass of wine (148 mL), or one 1 oz glass of hard liquor (44 mL). Lifestyle Do not use any products that contain nicotine or tobacco. These products include cigarettes, chewing tobacco, and vaping devices, such as e-cigarettes. If you need help quitting,  ask your health care provider. Do not use street drugs. Do not share needles. Ask your health care provider for help if you need support or information about quitting drugs. General instructions Schedule regular health, dental, and eye exams. Stay current with your vaccines. Tell your health care provider if: You often feel depressed. You have ever been abused or do not feel safe at home. Summary Adopting a healthy lifestyle and getting preventive care are important in promoting health and wellness. Follow your health care provider's instructions about healthy diet, exercising, and getting tested or screened for diseases. Follow your health care provider's instructions on monitoring your cholesterol and blood pressure. This information is not intended to replace advice given to you by your health care provider. Make sure you discuss any questions you have with your health care provider. Document Revised: 04/06/2021 Document Reviewed: 04/06/2021 Elsevier Patient Education  2024 [employer].

## 2024-06-29 NOTE — Assessment & Plan Note (Signed)
We discussed the importance of regular physical activity and healthy diet for prevention of chronic illness and/or complications. Preventive guidelines reviewed. Vaccination up to date. Next CPE in a year. 

## 2024-06-29 NOTE — Progress Notes (Signed)
 HPI: Ms.Elexus JANNY CRUTE is a 33 y.o. female, who is here today for her routine physical.  Last CPE: 04/11/2023  Since then, she was seen at [facility], at [facility] for an abscess, and ENT for tonsil stones.   Exercise: Walking the dog around the neighbor daily and using treadmill every other day.  Diet: Eating out most of the time. Not eating vegetables daily.  Sleep: 6-7 hours/night  Smoking: Former, quit in 2013.  Alcohol consumption: Occasionally.  Dental: UTD with routine dental care.  Vision: No concerns for vision. Does not require correctional lenses.   LMP: 3 weeks ago.  Last seen by OB/GYN: 02/2024.   Health Maintenance  Topic Date Due   Pap with HPV screening  03/12/2021   COVID-19 Vaccine (2 - 2024-25 season) 07/15/2024*   Flu Shot  08/03/2024*   Hepatitis B Vaccine (1 of 3 - 19+ 3-dose series) 06/29/2025*   HPV Vaccine (1 - 3-dose SCDM series) 06/29/2025*   DTaP/Tdap/Td vaccine (2 - Td or Tdap) 04/27/2026   Hepatitis C Screening  Completed   HIV Screening  Completed   Meningitis B Vaccine  Aged Out  *Topic was postponed. The date shown is not the original due date.   Immunization History  Administered Date(s) Administered   Influenza,inj,Quad PF,6+ Mos 08/01/2018   PFIZER(Purple Top)SARS-COV-2 Vaccination 02/14/2020   Tdap 04/27/2016   Chronic medical problems:   Weight management Pt [facility] Last followed up on 06/15/23. She continues working on healthy lifestyle interventions including regular exercise.  In the past year she has lost about 23 lbs.  Wt Readings from Last 5 Encounters:  06/29/24 234 lb (106.1 kg)  06/15/23 244 lb (110.7 kg)  05/10/23 254 lb (115.2 kg)  04/21/23 251 lb (113.9 kg)  04/11/23 257 lb 6 oz (116.7 kg)   Vitamin D  deficiency Not currently on any supplementation.  Was previously on ergocalciferol  50K units once weekly, managed by [facility].   States she is willing to resume supplementation OTC.  Lab Results  Component Value Date   VD25OH 17.8 (L) 05/10/2023   She has no acute concerns today.  Review of Systems  Constitutional:  Negative for activity change, appetite change and fever.  HENT:  Negative for mouth sores, sore throat and trouble swallowing.   Eyes:  Negative for redness and visual disturbance.  Respiratory:  Negative for cough, shortness of breath and wheezing.   Cardiovascular:  Negative for chest pain and leg swelling.  Gastrointestinal:  Negative for abdominal pain, nausea and vomiting.  Endocrine: Negative for cold intolerance, heat intolerance, polydipsia, polyphagia and polyuria.  Genitourinary:  Negative for decreased urine volume, dysuria and hematuria.  Musculoskeletal:  Negative for gait problem and myalgias.  Skin:  Negative for color change and rash.  Allergic/Immunologic: Negative for environmental allergies.  Neurological:  Negative for seizures, syncope, weakness and headaches.  Hematological:  Negative for adenopathy. Does not bruise/bleed easily.  Psychiatric/Behavioral:  Negative for confusion. The patient is not nervous/anxious.   All other systems reviewed and are negative.  No current outpatient medications on file prior to visit.   No current facility-administered medications on file prior to visit.   Past Medical History:  Diagnosis Date   Abscess of axilla, right    B12 deficiency    Food allergy    Hidradenitis suppurativa    SOB (shortness of breath)    UTI (lower urinary tract infection)  Vitamin D  deficiency    History reviewed. No pertinent surgical history.  Allergies  Allergen Reactions   Fish Allergy    Other Rash and Swelling    Family History  Problem Relation Age of Onset   Diabetes Father    Hypertension Father     Social History   Socioeconomic History   Marital status: Single    Spouse name: Not on file   Number of children: Not on file   Years  of education: Not on file   Highest education level: Not on file  Occupational History   Occupation: accountant  Tobacco Use   Smoking status: Former    Current packs/day: 0.00    Types: Cigarettes    Quit date: 02/06/2012    Years since quitting: 12.4   Smokeless tobacco: Never  Substance and Sexual Activity   Alcohol use: Yes   Drug use: No   Sexual activity: Not on file  Other Topics Concern   Not on file  Social History Narrative   Not on file   Social Drivers of Health   Financial Resource Strain: Not on file  Food Insecurity: Not on file  Transportation Needs: Not on file  Physical Activity: Not on file  Stress: Not on file  Social Connections: Unknown (04/03/2022)   Received from [facility]   Social Network    Social Network: Not on file    Vitals:   06/29/24 0928  BP: 128/80  Pulse: 82  Resp: 12  Temp: 99.1 F (37.3 C)  SpO2: 99%   Body mass index is 37.77 kg/m.  Wt Readings from Last 3 Encounters:  06/29/24 234 lb (106.1 kg)  06/15/23 244 lb (110.7 kg)  05/10/23 254 lb (115.2 kg)   Physical Exam Vitals and nursing note reviewed.  Constitutional:      General: She is not in acute distress.    Appearance: She is well-developed.  HENT:     Head: Normocephalic and atraumatic.     Right Ear: Hearing, tympanic membrane, ear canal and external ear normal.     Left Ear: Hearing, tympanic membrane, ear canal and external ear normal.     Mouth/Throat:     Mouth: Mucous membranes are moist.     Pharynx: Oropharynx is clear. Uvula midline.  Eyes:     Extraocular Movements: Extraocular movements intact.     Conjunctiva/sclera: Conjunctivae normal.     Pupils: Pupils are equal, round, and reactive to light.  Neck:     Thyroid: No thyroid mass or thyromegaly.  Cardiovascular:     Rate and Rhythm: Normal rate and regular rhythm.     Pulses:          Dorsalis pedis pulses are 2+ on the right side and 2+ on the left side.       Posterior tibial pulses  are 2+ on the right side and 2+ on the left side.     Heart sounds: No murmur heard. Pulmonary:     Effort: Pulmonary effort is normal. No respiratory distress.     Breath sounds: Normal breath sounds.  Abdominal:     Palpations: Abdomen is soft. There is no hepatomegaly or mass.     Tenderness: There is no abdominal tenderness.  Genitourinary:    Comments: Deferred to gyn. Musculoskeletal:     Comments: No major deformity or signs of synovitis appreciated.  Lymphadenopathy:     Cervical: No cervical adenopathy.     Upper Body:  Right upper body: No supraclavicular adenopathy.     Left upper body: No supraclavicular adenopathy.  Skin:    General: Skin is warm.     Findings: No erythema or rash.  Neurological:     General: No focal deficit present.     Mental Status: She is alert and oriented to person, place, and time.     Cranial Nerves: No cranial nerve deficit.     Coordination: Coordination normal.     Gait: Gait normal.     Deep Tendon Reflexes:     Reflex Scores:      Bicep reflexes are 2+ on the right side and 2+ on the left side.      Patellar reflexes are 2+ on the right side and 2+ on the left side. Psychiatric:        Mood and Affect: Mood and affect normal.    ASSESSMENT AND PLAN: Ms. JAZZY PARMER was here today annual physical examination.  Orders Placed This Encounter  Procedures   Basic metabolic panel with GFR   Hemoglobin A1c   Lipid panel   VITAMIN D  25 Hydroxy (Vit-D Deficiency, Fractures)   Lab Results  Component Value Date   VD25OH 16.42 (L) 06/29/2024   Lab Results  Component Value Date   NA 137 06/29/2024   CL 102 06/29/2024   K 4.2 06/29/2024   CO2 26 06/29/2024   BUN 5 (L) 06/29/2024   CREATININE 0.65 06/29/2024   GFR 115.78 06/29/2024   CALCIUM 9.6 06/29/2024   ALBUMIN 4.4 05/10/2023   GLUCOSE 90 06/29/2024   Lab Results  Component Value Date   HGBA1C 5.3 06/29/2024   Lab Results  Component Value Date   CHOL 180  06/29/2024   HDL 43.30 06/29/2024   LDLCALC 123 (H) 06/29/2024   TRIG 66.0 06/29/2024   CHOLHDL 4 06/29/2024   Routine general medical examination at a health care facility Assessment & Plan: We discussed the importance of regular physical activity and healthy diet for prevention of chronic illness and/or complications. Preventive guidelines reviewed. Vaccination up to date. Next CPE in a year.   Screening for lipoid disorders -     Lipid panel; Future  Screening for endocrine, metabolic and immunity disorder -     Basic metabolic panel with GFR; Future -     Hemoglobin A1c; Future  Vitamin D  deficiency, unspecified Assessment & Plan: She is not on vit D supplementation, Further recommendations according to 25 OH vit D result.  Orders: -     VITAMIN D  25 Hydroxy (Vit-D Deficiency, Fractures); Future   Return in 1 year (on 06/29/2025) for CPE.  I, Vernell Forest, acting as a scribe for Boniface Goffe [place], MD., have documented all relevant documentation on the behalf of Vidyuth Belsito [place], MD, as directed by   while in the presence of Jeremyah Jelley [place], MD.  I, Dorine Duffey [place], MD, have reviewed all documentation for this visit. The documentation on 06/30/24 for the exam, diagnosis, procedures, and orders are all accurate and complete.   Haille Pardi G. [place], MD  [facility]

## 2024-06-30 ENCOUNTER — Ambulatory Visit: Payer: Self-pay | Admitting: Family Medicine

## 2024-06-30 MED ORDER — VITAMIN D (ERGOCALCIFEROL) 1.25 MG (50000 UNIT) PO CAPS: 50000.0000 [IU] | ORAL_CAPSULE | ORAL | 0 refills | Status: AC

## 2024-06-30 NOTE — Assessment & Plan Note (Signed)
 She is not on vit D supplementation, Further recommendations according to 25 OH vit D result.

## 2024-08-08 ENCOUNTER — Encounter: Admitting: Obstetrics and Gynecology

## 2024-08-17 ENCOUNTER — Telehealth: Payer: Self-pay | Admitting: Family Medicine

## 2024-08-17 NOTE — Telephone Encounter (Signed)
 Copied from CRM [id number]. Topic: Referral - Request for Referral >> Aug 17, 2024 10:06 AM Treva T wrote: Did the patient discuss referral with their provider in the last year? Yes   Appointment offered? Yes  Type of order/referral and detailed reason for visit: Gastroenterology, for overall examination of digestive system, with no current problems at time of call  Preference of office, provider, location: [facility], on [address] [place]  If referral order, have you been seen by this specialty before? No  Can we respond through MyChart? No, patient prefers a phone call [phone] regarding status of referral

## 2024-08-20 NOTE — Telephone Encounter (Signed)
 We have not discussed GI symptoms in the office recently. Gastroenterology referral can be placed. Dx: pt requested procedure. Please explain that her health insurance may not cover it. Thanks, BJ

## 2024-08-20 NOTE — Telephone Encounter (Signed)
 Patient states she would like to follow up with Dr. [place] regarding symptoms prior to GI referral. Appt made.

## 2024-08-24 ENCOUNTER — Ambulatory Visit: Admitting: Family Medicine

## 2024-08-24 ENCOUNTER — Encounter: Payer: Self-pay | Admitting: Family Medicine

## 2024-08-24 VITALS — BP 118/74 | HR 76 | Temp 97.6°F | Resp 16 | Ht 66.0 in | Wt 227.8 lb

## 2024-08-24 DIAGNOSIS — Z5189 Encounter for other specified aftercare: Secondary | ICD-10-CM

## 2024-08-24 DIAGNOSIS — R196 Halitosis: Secondary | ICD-10-CM

## 2024-08-24 NOTE — Progress Notes (Signed)
 ACUTE VISIT Chief Complaint  Patient presents with   Medical Management of Chronic Issues    Referral to Gastroenterology     Discussed the use of AI scribe software for clinical note transcription with the patient, who gave verbal consent to proceed.  History of Present Illness Leslie Adams is a 33 year old female with past medical history significant for suppurative hidradenitis and vitamin D  deficiency who presents with chronic bad breath.  She has experienced bad breath for several years, initially attributing it to smoking. Despite quitting smoking, the issue persists.  Hx of tonsil stones. She has been evaluated by a dentist and an ENT specialist, but no cause was identified.  The bad breath occurs throughout the day and is not limited to the morning.  No associated symptoms such as burping, heartburn, abdominal pain, nausea, vomiting, nasal congestion, rhinorrhea, or post-nasal drainage are present.  She maintains good oral hygiene, brushing her teeth twice daily and flossing, but these measures have not alleviated the problem. She describes the condition as impacting her quality of life, stating 'It's not a good quality of life for me.  Requesting referral to GI.  Review of Systems  Constitutional:  Negative for activity change, appetite change, chills and fever.  HENT:  Negative for mouth sores and sore throat.   Respiratory:  Negative for cough, shortness of breath and wheezing.   Cardiovascular:  Negative for chest pain, palpitations and leg swelling.  Endocrine: Negative for cold intolerance and heat intolerance.  Musculoskeletal:  Negative for gait problem and myalgias.  Skin:  Negative for rash.  See other pertinent positives and negatives in HPI.  Current Outpatient Medications on File Prior to Visit  Medication Sig Dispense Refill   Vitamin D , Ergocalciferol , (DRISDOL ) 1.25 MG (50000 UNIT) CAPS capsule Take 1 capsule (50,000 Units total) by mouth every  7 (seven) days for 12 doses. 12 capsule 0   No current facility-administered medications on file prior to visit.   Past Medical History:  Diagnosis Date   Abscess of axilla, right    B12 deficiency    Food allergy    Hidradenitis suppurativa    SOB (shortness of breath)    UTI (lower urinary tract infection)    Vitamin D  deficiency    Allergies  Allergen Reactions   Fish Allergy    Other Rash and Swelling    Social History   Socioeconomic History   Marital status: Single    Spouse name: Not on file   Number of children: Not on file   Years of education: Not on file   Highest education level: Not on file  Occupational History   Occupation: accountant  Tobacco Use   Smoking status: Former    Current packs/day: 0.00    Types: Cigarettes    Quit date: 02/06/2012    Years since quitting: 12.5   Smokeless tobacco: Never  Substance and Sexual Activity   Alcohol use: Yes   Drug use: No   Sexual activity: Not on file  Other Topics Concern   Not on file  Social History Narrative   Not on file   Social Drivers of Health   Financial Resource Strain: Not on file  Food Insecurity: Not on file  Transportation Needs: Not on file  Physical Activity: Not on file  Stress: Not on file  Social Connections: Unknown (04/03/2022)   Received from [facility]   Social Network    Social Network: Not on file  Vitals:   08/24/24 0838  BP: 118/74  Pulse: 76  Resp: 16  Temp: 97.6 F (36.4 C)   Body mass index is 36.77 kg/m.  Physical Exam Vitals and nursing note reviewed.  Constitutional:      General: She is not in acute distress.    Appearance: She is well-developed.  HENT:     Head: Normocephalic and atraumatic.     Mouth/Throat:     Lips: No lesions.     Mouth: Mucous membranes are moist.     Tongue: No lesions.     Pharynx: Oropharynx is clear.     Tonsils: No tonsillar exudate.  Eyes:     Conjunctiva/sclera: Conjunctivae normal.  Cardiovascular:     Rate  and Rhythm: Normal rate and regular rhythm.     Heart sounds: No murmur heard. Pulmonary:     Effort: Pulmonary effort is normal. No respiratory distress.     Breath sounds: Normal breath sounds.  Abdominal:     Palpations: Abdomen is soft. There is no mass.     Tenderness: There is no abdominal tenderness.  Lymphadenopathy:     Cervical: No cervical adenopathy.  Skin:    General: Skin is warm.     Findings: No erythema or rash.  Neurological:     General: No focal deficit present.     Mental Status: She is alert and oriented to person, place, and time.     Gait: Gait normal.  Psychiatric:        Mood and Affect: Mood and affect normal.   ASSESSMENT AND PLAN:  Leslie Adams was seen today for halitosis concerns.  Halitosis -     Ambulatory referral to Gastroenterology  Follow-up medical care requested by patient -     Ambulatory referral to Gastroenterology  We discussed possible etiologies, explained that gastrointestinal causes are not a very common etiology. She has been evaluated by ENT and dentist, not clear etiology has been identified. She has history of tonsil stones, which could be a contributing factor. Some general recommendations given. GI referral placed as requested.  Return if symptoms worsen or fail to improve.  Glennie Bose G. [place], MD  [facility]. [place] office.

## 2024-08-24 NOTE — Patient Instructions (Addendum)
 A few things to remember from today's visit:  Halitosis Gastrointestinal causes are not common. Appt with gastro will be arranged as requested. For now I recommended:  ?Sugar-free chewing gums (which stimulate saliva production).  ?Good hydration.  ?Decreasing alcohol and coffee intake.  ?Proper dental care and oral hygiene, including daily flossing.  ?Gentle cleaning of the posterior portion of the tongue dorsum (eg, with a plastic tongue cleaner).  ?Rinsing and gargling with a mouthwash before bedtime.  Do not use My Chart to request refills or for acute issues that need immediate attention. If you send a my chart message, it may take a few days to be addressed, specially if I am not in the office.  Please be sure medication list is accurate. If a new problem present, please set up appointment sooner than planned today.
